# Patient Record
Sex: Male | Born: 1985 | Race: Black or African American | Hispanic: No | Marital: Single | Smoking: Former smoker
Health system: Southern US, Community
[De-identification: ages and names within clinical notes are randomized; demographics above are authoritative.]

---

## 2015-07-11 DIAGNOSIS — R0602 Shortness of breath: Secondary | ICD-10-CM | POA: Insufficient documentation

## 2015-07-11 DIAGNOSIS — R079 Chest pain, unspecified: Secondary | ICD-10-CM | POA: Diagnosis present

## 2015-07-11 DIAGNOSIS — Z720 Tobacco use: Secondary | ICD-10-CM | POA: Insufficient documentation

## 2015-07-11 DIAGNOSIS — K219 Gastro-esophageal reflux disease without esophagitis: Secondary | ICD-10-CM | POA: Diagnosis not present

## 2015-07-12 ENCOUNTER — Emergency Department (HOSPITAL_COMMUNITY)
Admission: EM | Admit: 2015-07-12 | Discharge: 2015-07-12 | Disposition: A | Discharge status: Home / Self Care | Payer: BLUE CROSS/BLUE SHIELD | Attending: Emergency Medicine | Admitting: Emergency Medicine

## 2015-07-12 ENCOUNTER — Emergency Department (HOSPITAL_COMMUNITY): Payer: BLUE CROSS/BLUE SHIELD

## 2015-07-12 ENCOUNTER — Encounter (HOSPITAL_COMMUNITY): Payer: Self-pay | Admitting: *Deleted

## 2015-07-12 DIAGNOSIS — K219 Gastro-esophageal reflux disease without esophagitis: Secondary | ICD-10-CM

## 2015-07-12 LAB — BASIC METABOLIC PANEL
ANION GAP: 9 (ref 5–15)
BUN: 11 mg/dL (ref 6–20)
CO2: 28 mmol/L (ref 22–32)
Calcium: 8.6 mg/dL — ABNORMAL LOW (ref 8.9–10.3)
Chloride: 98 mmol/L — ABNORMAL LOW (ref 101–111)
Creatinine, Ser: 1.01 mg/dL (ref 0.61–1.24)
GFR calc Af Amer: >60 mL/min (ref >60)
GFR calc non Af Amer: >60 mL/min (ref >60)
GLUCOSE: 94 mg/dL (ref 65–99)
POTASSIUM: 3.4 mmol/L — AB (ref 3.5–5.1)
Sodium: 135 mmol/L (ref 135–145)

## 2015-07-12 LAB — CBC
HEMATOCRIT: 37.8 % — AB (ref 39.0–52.0)
HEMOGLOBIN: 12.7 g/dL — AB (ref 13.0–17.0)
MCH: 29.5 pg (ref 26.0–34.0)
MCHC: 33.6 g/dL (ref 30.0–36.0)
MCV: 87.7 fL (ref 78.0–100.0)
Platelets: 225 10*3/uL (ref 150–400)
RBC: 4.31 MIL/uL (ref 4.22–5.81)
RDW: 11.8 % (ref 11.5–15.5)
WBC: 6.7 10*3/uL (ref 4.0–10.5)

## 2015-07-12 LAB — I-STAT TROPONIN, ED
TROPONIN I, POC: 0 ng/mL (ref 0.00–0.08)
Troponin i, poc: 0 ng/mL (ref 0.00–0.08)

## 2015-07-12 MED ORDER — DICYCLOMINE HCL 10 MG/ML IM SOLN
20.0000 mg | Freq: Once | INTRAMUSCULAR | Status: AC
  Administered 2015-07-12: 20 mg via INTRAMUSCULAR
  Filled 2015-07-12: qty 2

## 2015-07-12 MED ORDER — OMEPRAZOLE 20 MG PO CPDR: 20.0000 mg | DELAYED_RELEASE_CAPSULE | Freq: Every day | ORAL | Status: DC

## 2015-07-12 MED ORDER — GI COCKTAIL ~~LOC~~
30.0000 mL | Freq: Once | ORAL | Status: AC
  Administered 2015-07-12: 30 mL via ORAL
  Filled 2015-07-12: qty 30

## 2015-07-12 NOTE — ED Provider Notes (Signed)
CSN: [id number]   Arrival date & time 07/11/15 2353  History  By signing my name below, I, Justin Haas, attest that this documentation has been prepared under the direction and in the presence of Justin Handyside, MD. Electronically Signed: Bethel Haas, ED Scribe. 07/12/2015. 3:17 AM.  Chief Complaint  Patient presents with  . Chest Pain  . Shortness of Breath    HPI Patient is a 29 y.o. male presenting with chest pain. The history is provided by the patient. No language interpreter was used.  Chest Pain Chest pain location: Central chest. Pain quality: tightness   Pain quality comment:  Heaviness Pain radiates to:  Does not radiate Pain radiates to the back: no   Pain severity:  Moderate Onset quality:  Gradual Duration:  6 hours Timing:  Constant Progression:  Improving Chronicity:  New Context: eating   Relieved by:  Nothing Worsened by:  Nothing tried Ineffective treatments:  None tried Associated symptoms: nausea, shortness of breath and vomiting   Risk factors: male sex   Risk factors: no diabetes mellitus, no hypertension, no prior DVT/PE and no surgery    Justin Haas is a 29 y.o. male who presents to the Emergency Department complaining of constant central chest pain with gradual onset around 10 PM last night after eating dinner. He ate a sandwich (with mayo, mustard, and [place]) and BBQ chips. The pain is described as tightness/heaviness and rated 5/10 in severity. Associated symptoms include SOB, nausea, vomiting (2 episodes after going to bed), and belching(10+ episodes). Pt denies flatulence, a bitter taste in the mouth, and LE swelling. No recent travel.   History reviewed. No pertinent past medical history.  History reviewed. No pertinent past surgical history.  History reviewed. No pertinent family history.  Social History  Substance Use Topics  . Smoking status: Current Every Day Smoker -- 0.50 packs/day    Types: Cigarettes  . Smokeless tobacco:  None  . Alcohol Use: Yes     Review of Systems  Respiratory: Positive for shortness of breath.   Cardiovascular: Positive for chest pain.  Gastrointestinal: Positive for nausea and vomiting.       Belching  All other systems reviewed and are negative.  Home Medications   Prior to Admission medications   Not on File    Allergies  Review of patient's allergies indicates no known allergies.  Triage Vitals: BP 128/73 mmHg  Pulse 72  Temp(Src) 98 F (36.7 C) (Oral)  Resp 18  Ht  (1.905 m)  Wt 198 lb 8 oz (90.039 kg)  BMI 24.81 kg/m2  SpO2 99%  Physical Exam  Constitutional: He is oriented to person, place, and time. He appears well-developed and well-nourished. No distress.  HENT:  Head: Normocephalic.  Mouth/Throat: Oropharynx is clear and moist. No oropharyngeal exudate.  Moist mucous membranes No exudate  Eyes: Pupils are equal, round, and reactive to light.  Neck: Normal range of motion. Neck supple.  Trachea midline No crepitance  Cardiovascular: Normal rate, regular rhythm and intact distal pulses.   Pulmonary/Chest: Effort normal and breath sounds normal. No stridor. No respiratory distress. He has no wheezes. He has no rales.  CTAB  Abdominal: Soft. He exhibits no mass. Bowel sounds are increased. There is no tenderness. There is no rebound and no guarding.  Musculoskeletal: Normal range of motion. He exhibits no edema or tenderness.  Neurological: He is alert and oriented to person, place, and time. He has normal reflexes.  Skin: Skin is warm and dry.  He is not diaphoretic.  Psychiatric: He has a normal mood and affect. His behavior is normal.  Nursing note and vitals reviewed.   ED Course  Procedures   DIAGNOSTIC STUDIES: Oxygen Saturation is 99% on RA, normal by my interpretation.    COORDINATION OF CARE: 3:12 AM Discussed treatment plan which includes CXR, EKG, lab work, GI coktail with pt at bedside and pt agreed to plan.  Labs Reviewed   BASIC METABOLIC PANEL - Abnormal; Notable for the following:    Potassium 3.4 (*)    Chloride 98 (*)    Calcium 8.6 (*)    All other components within normal limits  CBC - Abnormal; Notable for the following:    Hemoglobin 12.7 (*)    HCT 37.8 (*)    All other components within normal limits  I-STAT TROPOININ, ED  I-STAT TROPOININ, ED    Imaging Review Dg Chest 2 View  07/12/2015  CLINICAL DATA:  Acute onset of mid chest tightness and shortness of breath. Initial encounter. EXAM: CHEST  2 VIEW COMPARISON:  None. FINDINGS: The lungs are well-aerated and clear. There is no evidence of focal opacification, pleural effusion or pneumothorax. The heart is normal in size; the mediastinal contour is within normal limits. No acute osseous abnormalities are seen. IMPRESSION: No acute cardiopulmonary process seen. Electronically Signed   By: Justin RaiderJeffery  Chang M.D.   On: 07/12/2015 00:57    I personally reviewed and evaluated these images and lab results as a part of my medical decision-making.   EKG Interpretation  Date/Time:  Tuesday July 12 2015 00:09:44 EDT Ventricular Rate:  74 PR Interval:  134 QRS Duration: 86 QT Interval:  390 QTC Calculation: 432 R Axis:   66 Text Interpretation:  Normal sinus rhythm Confirmed by [facility]  MD, Justin Haas ([id number]) on 07/12/2015 2:50:02 AM    MDM   Final diagnoses:  None   HEART Score is 0. MI ruled out with normal EKG and negative troponin X 2. Well's Score is 0. PERC negative. Suspect GERD. Symptoms resolved post GI cocktail will write for omeprazole and gerd friendly diet.    I personally performed the services described in this documentation, which was scribed in my presence. The recorded information has been reviewed and is accurate.      Justin BlamerApril Lorali Khamis, MD 07/12/15 [phone]

## 2015-07-12 NOTE — ED Notes (Signed)
Pt reports gradually worsening central chest heaviness that occurred shortly after eating, pt experienced n/v x3 episodes and increasing shortness of breath when he attempted to go to sleep. Denies dizziness or light headedness. Pt is no acute distress, skin warm and dry.

## 2015-09-03 ENCOUNTER — Emergency Department (HOSPITAL_COMMUNITY)
Admission: EM | Admit: 2015-09-03 | Discharge: 2015-09-03 | Disposition: A | Discharge status: Home / Self Care | Payer: BLUE CROSS/BLUE SHIELD | Attending: Emergency Medicine | Admitting: Emergency Medicine

## 2015-09-03 ENCOUNTER — Emergency Department (HOSPITAL_COMMUNITY): Payer: BLUE CROSS/BLUE SHIELD

## 2015-09-03 ENCOUNTER — Encounter (HOSPITAL_COMMUNITY): Payer: Self-pay | Admitting: Emergency Medicine

## 2015-09-03 DIAGNOSIS — W2209XA Striking against other stationary object, initial encounter: Secondary | ICD-10-CM | POA: Insufficient documentation

## 2015-09-03 DIAGNOSIS — S92424A Nondisplaced fracture of distal phalanx of right great toe, initial encounter for closed fracture: Secondary | ICD-10-CM | POA: Insufficient documentation

## 2015-09-03 DIAGNOSIS — F17210 Nicotine dependence, cigarettes, uncomplicated: Secondary | ICD-10-CM | POA: Insufficient documentation

## 2015-09-03 DIAGNOSIS — Y9389 Activity, other specified: Secondary | ICD-10-CM | POA: Insufficient documentation

## 2015-09-03 DIAGNOSIS — Y9289 Other specified places as the place of occurrence of the external cause: Secondary | ICD-10-CM | POA: Diagnosis not present

## 2015-09-03 DIAGNOSIS — S99921A Unspecified injury of right foot, initial encounter: Secondary | ICD-10-CM | POA: Diagnosis present

## 2015-09-03 DIAGNOSIS — Y999 Unspecified external cause status: Secondary | ICD-10-CM | POA: Diagnosis not present

## 2015-09-03 DIAGNOSIS — S92911A Unspecified fracture of right toe(s), initial encounter for closed fracture: Secondary | ICD-10-CM

## 2015-09-03 MED ORDER — NAPROXEN 500 MG PO TABS: 500.0000 mg | ORAL_TABLET | Freq: Two times a day (BID) | ORAL | Status: DC

## 2015-09-03 MED ORDER — HYDROCODONE-ACETAMINOPHEN 5-325 MG PO TABS: ORAL_TABLET | ORAL | Status: DC

## 2015-09-03 NOTE — ED Notes (Signed)
Josh, pa-c at the bedside. Verbal order for postop boot.

## 2015-09-03 NOTE — ED Notes (Signed)
Post op shoe applied to right foot. Patient reports comfortable fit when ambulating.

## 2015-09-03 NOTE — ED Notes (Signed)
Stumped R great toe around 5pm yesterday.  C/o pain and swelling.

## 2015-09-03 NOTE — Discharge Instructions (Signed)
Please read and follow all provided instructions.  Your diagnoses today include:  1. Fracture of great toe, left, closed, initial encounter     Tests performed today include:  An x-ray of the affected area - shows broken toe, not out of place  Vital signs. See below for your results today.   Medications prescribed:   Vicodin (hydrocodone/acetaminophen) - narcotic pain medication  DO NOT drive or perform any activities that require you to be awake and alert because this medicine can make you drowsy. BE VERY CAREFUL not to take multiple medicines containing Tylenol (also called acetaminophen). Doing so can lead to an overdose which can damage your liver and cause liver failure and possibly death.   Naproxen - anti-inflammatory pain medication  Do not exceed 500mg  naproxen every 12 hours, take with food  You have been prescribed an anti-inflammatory medication or NSAID. Take with food. Take smallest effective dose for the shortest duration needed for your pain. Stop taking if you experience stomach pain or vomiting.   Take any prescribed medications only as directed.  Home care instructions:   Follow any educational materials contained in this packet  Follow R.I.C.E. Protocol:  R - rest your injury   I  - use ice on injury without applying directly to skin  C - compress injury with bandage or splint  E - elevate the injury as much as possible  Follow-up instructions: Please follow-up with your primary care provider or the provided orthopedic physician (bone specialist) if you continue to have significant pain in 1 week.   Return instructions:   Please return if your toes or feet are numb or tingling, appear gray or blue, or you have severe pain (also elevate the leg and loosen splint or wrap if you were given one)  Please return to the Emergency Department if you experience worsening symptoms.   Please return if you have any other emergent concerns.  Additional  Information:  Your vital signs today were: BP 136/74 mmHg   Pulse 60   Temp(Src) 98 F (36.7 C) (Oral)   Resp 16   SpO2 100% If your blood pressure (BP) was elevated above 135/85 this visit, please have this repeated by your doctor within one month. --------------

## 2015-09-03 NOTE — ED Provider Notes (Signed)
CSN: [id number]     Arrival date & time 09/03/15  0315 History   First MD Initiated Contact with Patient 09/03/15 0601     Chief Complaint  Patient presents with  . Toe Pain     (Consider location/radiation/quality/duration/timing/severity/associated sxs/prior Treatment) HPI Comments: Patient presents with complaint of acute onset right great toe pain starting yesterday at approximately 5 PM. Patient stubbed his toe on a piece of metal. Patient complains of worsening pain and swelling. He went to work last night and was having difficulty with walking due to the pain. No treatments prior to arrival. Course is constant.  The history is provided by the patient.    History reviewed. No pertinent past medical history. History reviewed. No pertinent past surgical history. No family history on file. Social History  Substance Use Topics  . Smoking status: Current Every Day Smoker -- 0.50 packs/day    Types: Cigarettes  . Smokeless tobacco: None  . Alcohol Use: Yes    Review of Systems  Constitutional: Negative for activity change.  Musculoskeletal: Positive for joint swelling, arthralgias and gait problem. Negative for back pain and neck pain.  Skin: Negative for wound.  Neurological: Negative for weakness and numbness.      Allergies  Review of patient's allergies indicates no known allergies.  Home Medications   Prior to Admission medications   Medication Sig Start Date End Date Taking? Authorizing Provider  diphenhydrAMINE (BENADRYL) 25 mg capsule Take 25 mg by mouth every 6 (six) hours as needed for allergies.   Yes Historical Provider, MD  ibuprofen (ADVIL,MOTRIN) 200 MG tablet Take 600 mg by mouth every 6 (six) hours as needed for mild pain.   Yes Historical Provider, MD  omeprazole (PRILOSEC) 20 MG capsule Take 1 capsule (20 mg total) by mouth daily. Patient not taking: Reported on 09/03/2015 07/12/15   April Palumbo, MD   BP 142/84 mmHg  Pulse 73  Temp(Src) 98 F  (36.7 C) (Oral)  Resp 16  SpO2 100% Physical Exam  Constitutional: He appears well-developed and well-nourished.  HENT:  Head: Normocephalic and atraumatic.  Eyes: Conjunctivae are normal.  Neck: Normal range of motion. Neck supple.  Cardiovascular: Normal pulses.   Musculoskeletal: He exhibits tenderness. He exhibits no edema.       Right ankle: Normal.       Right foot: There is tenderness, bony tenderness and swelling.       Feet:  Neurological: He is alert. No sensory deficit.  Motor, sensation, and vascular distal to the injury is fully intact.   Skin: Skin is warm and dry.  Psychiatric: He has a normal mood and affect.  Nursing note and vitals reviewed.   ED Course  Procedures (including critical care time) Labs Review Labs Reviewed - No data to display  Imaging Review Dg Foot Complete Right  09/03/2015  CLINICAL DATA:  Stubbed great toe on a bed railing today.  Edema. EXAM: RIGHT FOOT COMPLETE - 3+ VIEW COMPARISON:  None. FINDINGS: Vague linear lucency below the distal phalangeal tuft of the right first toe. This suggest a nondisplaced fracture. No articular extension. No radiopaque soft tissue foreign bodies and no soft tissue gas collections. Right foot is otherwise unremarkable. IMPRESSION: Vague linear lucency along the shaft of the distal phalanx of the right first toe suggesting nondisplaced fracture. Electronically Signed   By: Burman Nieves M.D.   On: 09/03/2015 04:33   Dg Toe Great Right  09/03/2015  CLINICAL DATA:  Patient stubbed his right  big toe last night. Pain and swelling. EXAM: RIGHT GREAT TOE COMPARISON:  Right foot 09/03/2015 FINDINGS: Linear lucencies along the midshaft of the distal phalanx of the right first toe suggesting nondisplaced fractures. No articular involvement. The tuft itself appears to be spared. No soft tissue gas or foreign bodies. IMPRESSION: Linear lucencies in the distal phalanx of the right first toe suggesting nondisplaced  fractures. Electronically Signed   By: Burman NievesWilliam  Stevens M.D.   On: 09/03/2015 06:34   I have personally reviewed and evaluated these images and lab results as part of my medical decision-making.   EKG Interpretation None       7:13 AM Patient seen and examined.   Vital signs reviewed and are as follows: BP 136/74 mmHg  Pulse 60  Temp(Src) 98 F (36.7 C) (Oral)  Resp 16  SpO2 100%  Patient informed of x-ray results. Postop shoe given. Encouraged PCP/with a follow-up one week if still having difficulty walking or severe pain.  Patient counseled on use of narcotic pain medications. Counseled not to combine these medications with others containing tylenol. Urged not to drink alcohol, drive, or perform any other activities that requires focus while taking these medications. The patient verbalizes understanding and agrees with the plan.   MDM   Final diagnoses:  Fracture of toe, right, closed, initial encounter   Nondisplaced fracture of right great toe, distal phalanx. Pain control, rice protocol indicated. Toe is neurovascularly intact. No open wounds. No nail injury.    Renne CriglerJoshua Zuleima Haser, PA-C 09/03/15 [id number]  Tomasita CrumbleAdeleke Oni, MD 09/03/15 [phone]

## 2017-08-26 ENCOUNTER — Emergency Department (HOSPITAL_COMMUNITY)
Admission: EM | Admit: 2017-08-26 | Discharge: 2017-08-26 | Disposition: A | Discharge status: Home / Self Care | Payer: 59 | Attending: Emergency Medicine | Admitting: Emergency Medicine

## 2017-08-26 DIAGNOSIS — K0889 Other specified disorders of teeth and supporting structures: Secondary | ICD-10-CM | POA: Diagnosis not present

## 2017-08-26 DIAGNOSIS — J029 Acute pharyngitis, unspecified: Secondary | ICD-10-CM | POA: Diagnosis not present

## 2017-08-26 DIAGNOSIS — R05 Cough: Secondary | ICD-10-CM | POA: Insufficient documentation

## 2017-08-26 DIAGNOSIS — Z79899 Other long term (current) drug therapy: Secondary | ICD-10-CM | POA: Diagnosis not present

## 2017-08-26 DIAGNOSIS — F17210 Nicotine dependence, cigarettes, uncomplicated: Secondary | ICD-10-CM | POA: Diagnosis not present

## 2017-08-26 LAB — RAPID STREP SCREEN (MED CTR MEBANE ONLY): Streptococcus, Group A Screen (Direct): NEGATIVE

## 2017-08-26 MED ORDER — LIDOCAINE VISCOUS 2 % MT SOLN: 15.0000 mL | OROMUCOSAL | 0 refills | Status: DC | PRN

## 2017-08-26 NOTE — Discharge Instructions (Signed)
Please read and follow all provided instructions.  Your diagnoses today include: Viral Pharyngitis   Tests performed today include: Vital signs. See below for your results today.  Strep Test: This was negative. A throat culture was sent as a precaution and results will be available in 2-3 days. If it returns positive for strep, you will be called by our flow manager for further instructions.  Medications prescribed:  1. Take mucinex over the counter for nasal congestoin 2. Take ibuprofen for pain 3. Take viscous lidocaine for throat pain. Swish, gargle and spit as directed.   Please follow up with dentist for dental pain for dental pain using recourses.   Home care instructions:  At this time, it appears that your sore throat is caused by a viral infection. Antibiotics do NOT help a viral infection and can cause unwanted side effects. The fever should resolve in 2-3 days and sore throat should begin to resolve in 2-3 days as well. Continued to alternate between Tylenol and ibuprofen for pain. May consider over-the-counter Benadryl for additional relief (decrease secretions). Also discard your toothbrush and begin using a new one in 3 days.   Follow-up instructions: Please follow-up with your primary care provider in 2-3 days for follow up.   Return instructions:  Return to the ED sooner for worsening condition, inability to swallow, breathing difficulty, new concerns.  Additional Information:  Your vital signs today were: BP 122/78 (BP Location: Right Arm)    Pulse 66    Temp 98.8 F (37.1 C) (Oral)    Resp 16    Ht 6\' 3"  (1.905 m)    Wt 81.6 kg (180 lb)    SpO2 99%    BMI 22.50 kg/m  If your blood pressure (BP) was elevated above 135/85 this visit, please have this repeated by your doctor within one month. ---------------

## 2017-08-26 NOTE — ED Triage Notes (Signed)
Pt states since last night he has had throat pain into neck. Pt states he has had a cold for the last week. Pt breathing easily, no sob. Tonsils appear red.

## 2017-08-26 NOTE — ED Provider Notes (Signed)
[facility] Provider Note   CSN: [id number] Arrival date & time: 08/26/17  1224     History   Chief Complaint Chief Complaint  Patient presents with  . Sore Throat    HPI Justin Haas is a 31 y.o. male with no significant past medical history presents emergency department today for sore throat.  Patient states that over the last 1 week he has been having URI symptoms including non-productive cough, chest congestion, runny nose, postnasal drip and sore throat.  He notes his sore throat started as scratchiness and now has associated dysphasia.  He has been taking Benadryl every day for this with no relief.  He notes that he has taken ibuprofen today with mild to moderate relief.  Positive sick contacts including his daughter who has viral URI diagnosed 2 weeks ago.  His cough has resolved but he still has the congestion, runny nose and sore throat which is his reason for presenting today. Patient also complains that since having dental work done in September he feels pain in his left lower tooth on and off.  He notes no change in this recently.  Denies fever, chills, inability to control secretions, N/V, abdominal pain, voice change, sob, or trauma.    HPI  No past medical history on file.  There are no active problems to display for this patient.   No past surgical history on file.     Home Medications    Prior to Admission medications   Medication Sig Start Date End Date Taking? Authorizing Provider  diphenhydrAMINE (BENADRYL) 25 mg capsule Take 25 mg by mouth every 6 (six) hours as needed for allergies.    [provider]  HYDROcodone-acetaminophen (NORCO/VICODIN) 5-325 MG tablet Take 1-2 tablets every 6 hours as needed for severe pain 09/03/15   Renne CriglerGeiple, Joshua, PA-C  ibuprofen (ADVIL,MOTRIN) 200 MG tablet Take 600 mg by mouth every 6 (six) hours as needed for mild pain.    [provider]  naproxen (NAPROSYN) 500 MG  tablet Take 1 tablet (500 mg total) by mouth 2 (two) times daily. 09/03/15   Renne CriglerGeiple, Joshua, PA-C  omeprazole (PRILOSEC) 20 MG capsule Take 1 capsule (20 mg total) by mouth daily. Patient not taking: Reported on 09/03/2015 07/12/15   Cy BlamerPalumbo, April, MD    Family History No family history on file.  Social History Social History   Tobacco Use  . Smoking status: Current Every Day Smoker    Packs/day: 0.50    Types: Cigarettes  Substance Use Topics  . Alcohol use: Yes  . Drug use: Yes    Types: IV     Allergies   Patient has no known allergies.   Review of Systems Review of Systems  Constitutional: Negative for chills and fever.  HENT: Positive for congestion, dental problem, postnasal drip, rhinorrhea and sore throat. Negative for drooling, ear discharge, ear pain, facial swelling, sinus pressure, sinus pain, sneezing, trouble swallowing and voice change.   Respiratory: Positive for cough. Negative for chest tightness and shortness of breath.   Cardiovascular: Negative for chest pain.  Gastrointestinal: Negative for nausea and vomiting.  Musculoskeletal: Negative for neck stiffness.     Physical Exam Updated Vital Signs BP 122/78 (BP Location: Right Arm)   Pulse 66   Temp 98.8 F (37.1 C) (Oral)   Resp 16   Ht 6\' 3"  (1.905 m)   Wt 81.6 kg (180 lb)   SpO2 99%   BMI 22.50 kg/m   Physical  Exam  Constitutional: He appears well-developed and well-nourished.  HENT:  Head: Normocephalic and atraumatic.  Right Ear: External ear normal.  Left Ear: External ear normal.  Nose: Nose normal.  Mouth/Throat: Uvula is midline, oropharynx is clear and moist and mucous membranes are normal. No tonsillar exudate.  The patient has normal phonation and is in control of secretions. No stridor.  Midline uvula without edema. Soft palate rises symmetrically. Mild tonsillar erythema bilaterally without hypertophy or exudates. No PTA. Tongue protrusion is normal. No trismus. No creptius  on neck palpation and patient has good dentition. No facial or neck swelling. No gingival erythema or fluctuance noted. No floor edema. Mucus membranes moist.   Eyes: Pupils are equal, round, and reactive to light. Right eye exhibits no discharge. Left eye exhibits no discharge. No scleral icterus.  Neck: Trachea normal and phonation normal. Neck supple. No spinous process tenderness present. No neck rigidity. Normal range of motion present.  No meningismus  Cardiovascular: Normal rate, regular rhythm and intact distal pulses.  No murmur heard. Pulses:      Radial pulses are 2+ on the right side, and 2+ on the left side.       Dorsalis pedis pulses are 2+ on the right side, and 2+ on the left side.       Posterior tibial pulses are 2+ on the right side, and 2+ on the left side.  No lower extremity swelling or edema. Calves symmetric in size bilaterally.  Pulmonary/Chest: Effort normal and breath sounds normal. He exhibits no tenderness.  Abdominal: Soft. Bowel sounds are normal. There is no tenderness. There is no rebound and no guarding.  Musculoskeletal: He exhibits no edema.  Lymphadenopathy:    He has cervical adenopathy (shotty).  Neurological: He is alert.  Skin: Skin is warm and dry. No rash noted. He is not diaphoretic.  Psychiatric: He has a normal mood and affect.  Nursing note and vitals reviewed.    ED Treatments / Results  Labs (all labs ordered are listed, but only abnormal results are displayed) Labs Reviewed  RAPID STREP SCREEN (NOT AT [facility])  CULTURE, GROUP A STREP [facility]    EKG  EKG Interpretation None       Radiology No results found.  Procedures Procedures (including critical care time)  Medications Ordered in ED Medications - No data to display   Initial Impression / Assessment and Plan / ED Course  I have reviewed the triage vital signs and the nursing notes.  Pertinent labs & imaging results that were available during my care of the patient  were reviewed by me and considered in my medical decision making (see chart for details).     31 y.o. male with sore throat. Pt afebrile without tonsillar exudate, negative strep. Presents with mild cervical lymphadenopathy, & dysphagia; diagnosis of viral pharyngitis. No abx indicated. Discharged with symptomatic tx for pain.  Pt does not appear dehydrated, but did discuss importance of water rehydration. Presentation non concerning for PTA or RPA. No trismus or uvula deviation. Specific return precautions discussed. Pt able to drink water in ED without difficulty with intact air way. Recommended PCP follow up.  Patient with toothache.  No gross abscess or evidence of periapical abscess.  Exam unconcerning for Ludwig's angina or spread of infection.  Do not feel requires Abx. Will treat same as viral pharyngitis above and provide resources for patient to follow up with dentist. return precautions discussed. Patient appears safe for discharge.   Final Clinical Impressions(s) /  ED Diagnoses   Final diagnoses:  Viral pharyngitis  Pain, dental    ED Discharge Orders        Ordered    lidocaine (XYLOCAINE) 2 % solution  As needed     08/26/17 1409       Princella Pellegrini 08/26/17 1416    Jacalyn Lefevre, MD 08/26/17 1452

## 2017-08-28 ENCOUNTER — Encounter (HOSPITAL_COMMUNITY): Payer: Self-pay | Admitting: Emergency Medicine

## 2017-08-28 ENCOUNTER — Emergency Department (HOSPITAL_COMMUNITY)
Admission: EM | Admit: 2017-08-28 | Discharge: 2017-08-29 | Disposition: A | Discharge status: Home / Self Care | Payer: 59 | Attending: Emergency Medicine | Admitting: Emergency Medicine

## 2017-08-28 DIAGNOSIS — K0889 Other specified disorders of teeth and supporting structures: Secondary | ICD-10-CM | POA: Diagnosis present

## 2017-08-28 DIAGNOSIS — Z79899 Other long term (current) drug therapy: Secondary | ICD-10-CM | POA: Diagnosis not present

## 2017-08-28 DIAGNOSIS — F17210 Nicotine dependence, cigarettes, uncomplicated: Secondary | ICD-10-CM | POA: Insufficient documentation

## 2017-08-28 LAB — CULTURE, GROUP A STREP (THRC)

## 2017-08-28 MED ORDER — OXYCODONE-ACETAMINOPHEN 5-325 MG PO TABS
1.0000 | ORAL_TABLET | Freq: Once | ORAL | Status: AC
  Administered 2017-08-28: 1 via ORAL
  Filled 2017-08-28: qty 1

## 2017-08-28 NOTE — ED Triage Notes (Signed)
Patient reports persistent left upper/lower molar pain onset last week unrelieved by OTC pain medications .

## 2017-08-28 NOTE — ED Provider Notes (Signed)
[facility] Provider Note   CSN: [id number] Arrival date & time: 08/28/17  2121     History   Chief Complaint Chief Complaint  Patient presents with  . Dental Pain    HPI Justin Haas is a 31 y.o. male who presents to the emergency department with a chief complaint of left-sided dental pain.  He reports URI symptoms, including sore throat, nonproductive cough, chest congestion, rhinorrhea, postnasal drip that began 1 week ago, but resolved 2 days ago.  He reports that after his other symptoms resolved, that it felt like the dental pain was getting worse.  He also complains of associated left-sided neck pain.  He denies facial swelling, swelling to the neck, fever, chills, trismus, drooling, muffled voice, chest pain, shortness of breath.  He was seen and evaluated in the emergency department on 12/10 and discharged with viscous lidocaine for viral pharyngitis.  He has been treating his symptoms at home with viscous lidocaine, and 4-600 mg of Motrin twice a day without improvement.  He was given a dose of Percocet in the waiting room, and reported minimal improvement.  His last dental checkup was about 6 months ago, but the patient has not seen the dentist again because they were out of network with his dental insurance.  He is planning to call and get a dentist appointment in the next 1-2 days.  The history is provided by the patient. No language interpreter was used.    History reviewed. No pertinent past medical history.  There are no active problems to display for this patient.   History reviewed. No pertinent surgical history.     Home Medications    Prior to Admission medications   Medication Sig Start Date End Date Taking? Authorizing Provider  diphenhydrAMINE (BENADRYL) 25 mg capsule Take 25 mg by mouth every 6 (six) hours as needed for allergies.    [provider]  HYDROcodone-acetaminophen (NORCO/VICODIN) 5-325 MG  tablet Take 1-2 tablets every 6 hours as needed for severe pain 09/03/15   Renne CriglerGeiple, Joshua, PA-C  ibuprofen (ADVIL,MOTRIN) 200 MG tablet Take 600 mg by mouth every 6 (six) hours as needed for mild pain.    [provider]  lidocaine (XYLOCAINE) 2 % solution Use as directed 15 mLs in the mouth or throat as needed for mouth pain. 08/26/17   Maczis, Elmer SowMichael M, PA-C  naproxen (NAPROSYN) 500 MG tablet Take 1 tablet (500 mg total) by mouth 2 (two) times daily. 09/03/15   Renne CriglerGeiple, Joshua, PA-C  omeprazole (PRILOSEC) 20 MG capsule Take 1 capsule (20 mg total) by mouth daily. Patient not taking: Reported on 09/03/2015 07/12/15   Palumbo, April, MD  penicillin v potassium (VEETID) 500 MG tablet Take 1 tablet (500 mg total) by mouth 4 (four) times daily for 5 days. 08/29/17 09/03/17  Dajuan Turnley, Coral ElseMia A, PA-C    Family History No family history on file.  Social History Social History   Tobacco Use  . Smoking status: Current Every Day Smoker    Packs/day: 0.50    Types: Cigarettes  . Smokeless tobacco: Never Used  Substance Use Topics  . Alcohol use: Yes  . Drug use: Yes    Types: IV, Marijuana     Allergies   Patient has no known allergies.   Review of Systems Review of Systems  Constitutional: Negative for appetite change, chills and fever.  HENT: Positive for dental problem and ear pain. Negative for drooling, facial swelling, nosebleeds, postnasal drip, rhinorrhea and  trouble swallowing.   Eyes: Negative for pain and redness.  Respiratory: Negative for cough and wheezing.   Cardiovascular: Negative for chest pain.  Gastrointestinal: Negative for abdominal pain, nausea and vomiting.  Musculoskeletal: Negative for neck pain and neck stiffness.  Skin: Negative for color change and rash.  Neurological: Positive for headaches. Negative for weakness and light-headedness.  All other systems reviewed and are negative.    Physical Exam Updated Vital Signs BP (!) 164/97 (BP Location:  Right Arm)   Pulse 66   Temp 99.6 F (37.6 C) (Oral)   Resp 20   Ht 6\' 3"  (1.905 m)   Wt 81.6 kg (180 lb)   SpO2 100%   BMI 22.50 kg/m   Physical Exam  Constitutional: He appears well-developed.  HENT:  Head: Normocephalic.  Mouth/Throat: Uvula is midline, oropharynx is clear and moist and mucous membranes are normal. No trismus in the jaw. Normal dentition. No dental abscesses, uvula swelling or dental caries. No oropharyngeal exudate, posterior oropharyngeal edema, posterior oropharyngeal erythema or tonsillar abscesses. No tonsillar exudate.    No periodontal disease.  No gross dental caries.  Each individual maxillary and mandibular tooth on the left side of the mouth was individually palpated without focal tenderness.   Eyes: Conjunctivae are normal.  Neck: Normal range of motion. Neck supple. No tracheal deviation present.  No woody induration of the neck. No tripoding.  Cardiovascular: Normal rate, regular rhythm, normal heart sounds and intact distal pulses. Exam reveals no gallop and no friction rub.  No murmur heard. Pulmonary/Chest: Effort normal and breath sounds normal. No stridor. No respiratory distress. He has no wheezes. He has no rales. He exhibits no tenderness.  Abdominal: Soft. He exhibits no distension.  Lymphadenopathy:    He has no cervical adenopathy.  Neurological: He is alert.  Skin: Skin is warm and dry.  Psychiatric: His behavior is normal.  Nursing note and vitals reviewed.    ED Treatments / Results  Labs (all labs ordered are listed, but only abnormal results are displayed) Labs Reviewed - No data to display  EKG  EKG Interpretation None       Radiology No results found.  Procedures Procedures (including critical care time)  Medications Ordered in ED Medications  oxyCODONE-acetaminophen (PERCOCET/ROXICET) 5-325 MG per tablet 1 tablet (1 tablet Oral Given 08/28/17 2137)     Initial Impression / Assessment and Plan / ED Course    I have reviewed the triage vital signs and the nursing notes.  Pertinent labs & imaging results that were available during my care of the patient were reviewed by me and considered in my medical decision making (see chart for details).     Patient with toothache.  He was seen on 12/10 for similar, but symptoms have worsened. He is hemodynamically stable.  On physical exam, the patient has good dentition, but there is an area of edema and minimal redness to the buccal aspect of the gingiva surrounding tooth 17. No gross abscess. Exam unconcerning for Ludwig's angina or spread of infection.  No constitutional symptoms.  Will treat with penicillin and anti-inflammatories.  Urged patient to follow-up with dentist.  Dental resource list provided.  Strict return precautions given.  No acute distress.  The patient is safe for discharge at this time.  Final Clinical Impressions(s) / ED Diagnoses   Final diagnoses:  Pain, dental    ED Discharge Orders        Ordered    penicillin v potassium (VEETID) 500  MG tablet  4 times daily     08/29/17 0011       Barkley BoardsMcDonald, Aizley Stenseth A, PA-C 08/29/17 0013    Dione BoozeGlick, David, MD 08/29/17 [phone]

## 2017-08-29 MED ORDER — PENICILLIN V POTASSIUM 500 MG PO TABS: 500.0000 mg | ORAL_TABLET | Freq: Four times a day (QID) | ORAL | 0 refills | Status: AC

## 2017-08-29 NOTE — Discharge Instructions (Signed)
Use the provided dental guide to help find a dentist that accepts her dental insurance.  You can also go on your dental insurances website and search for dentist in the area that will accept her insurance.  Take 1 tablet of penicillin every 6 hours for the next 5 days.  Take 800 mg of ibuprofen with food every 8 hours to help with pain.  Scheduling this medication every 8 hours for the next 3-4 days may improve your symptoms.  You can continue to take 15 mL of viscous lidocaine every 3 hours as needed for mouth pain and sore throat.  If you develop new or worsening symptoms, including the inability to open her mouth, difficulty breathing, significant swelling to one side of the neck of the face, or fever that does not improve with Tylenol, please return to the emergency department for reevaluation.

## 2017-09-06 IMAGING — CR DG FOOT COMPLETE 3+V*R*
3 series · 3 of 3 positions shown · non-contrast
Comparison: None.

CLINICAL DATA: Stubbed great toe on a bed railing today.  Edema.

EXAM:
RIGHT FOOT COMPLETE - 3+ VIEW

[foot ap]
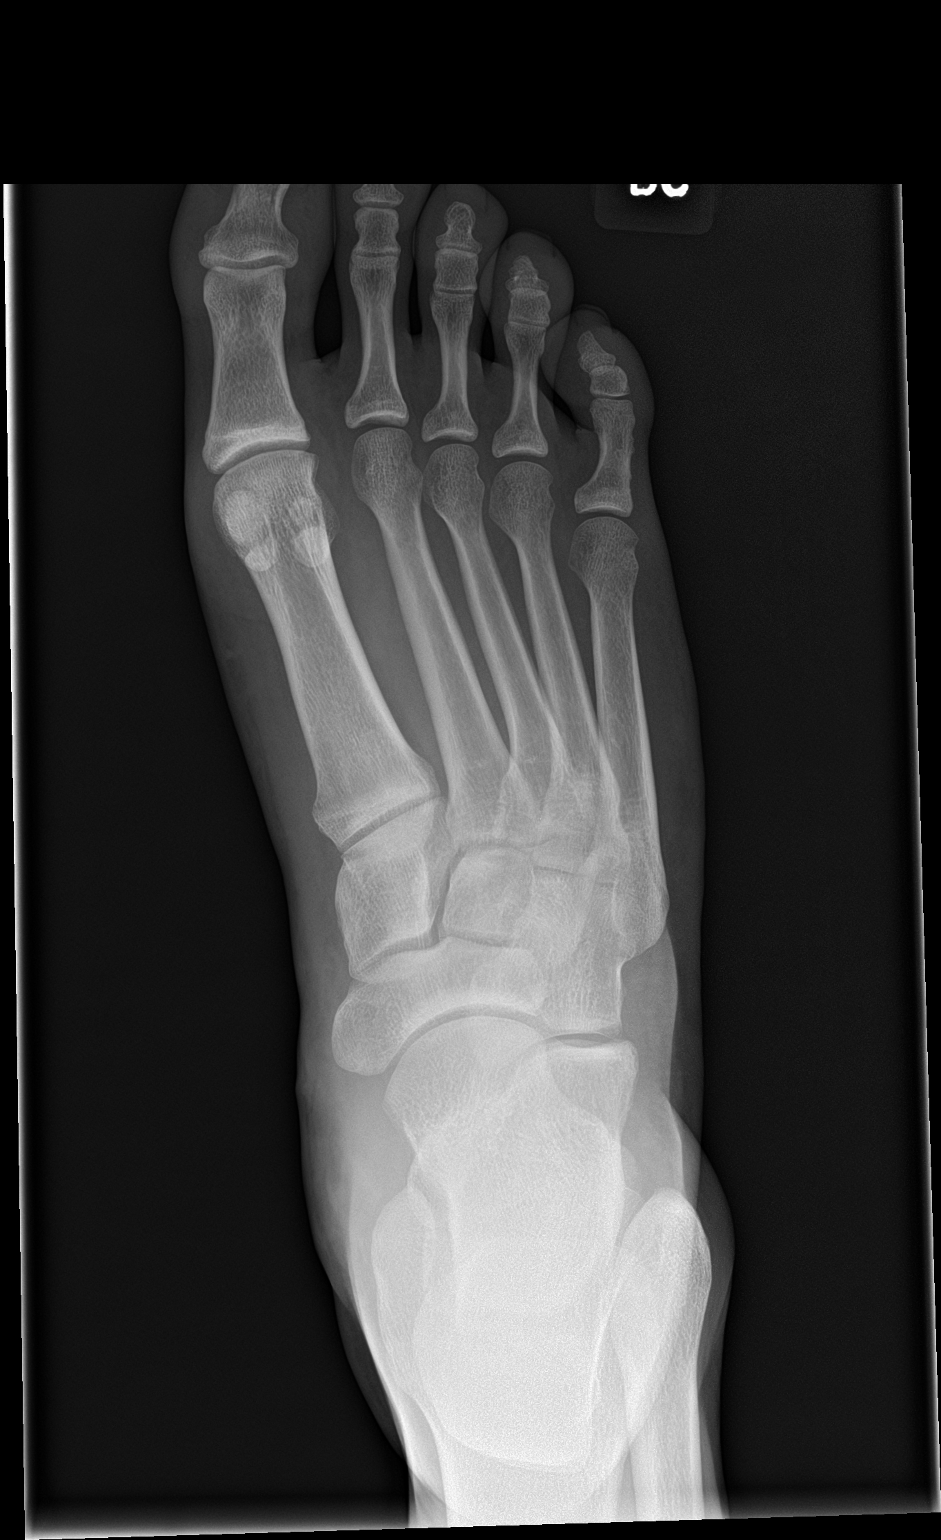

[foot obl]
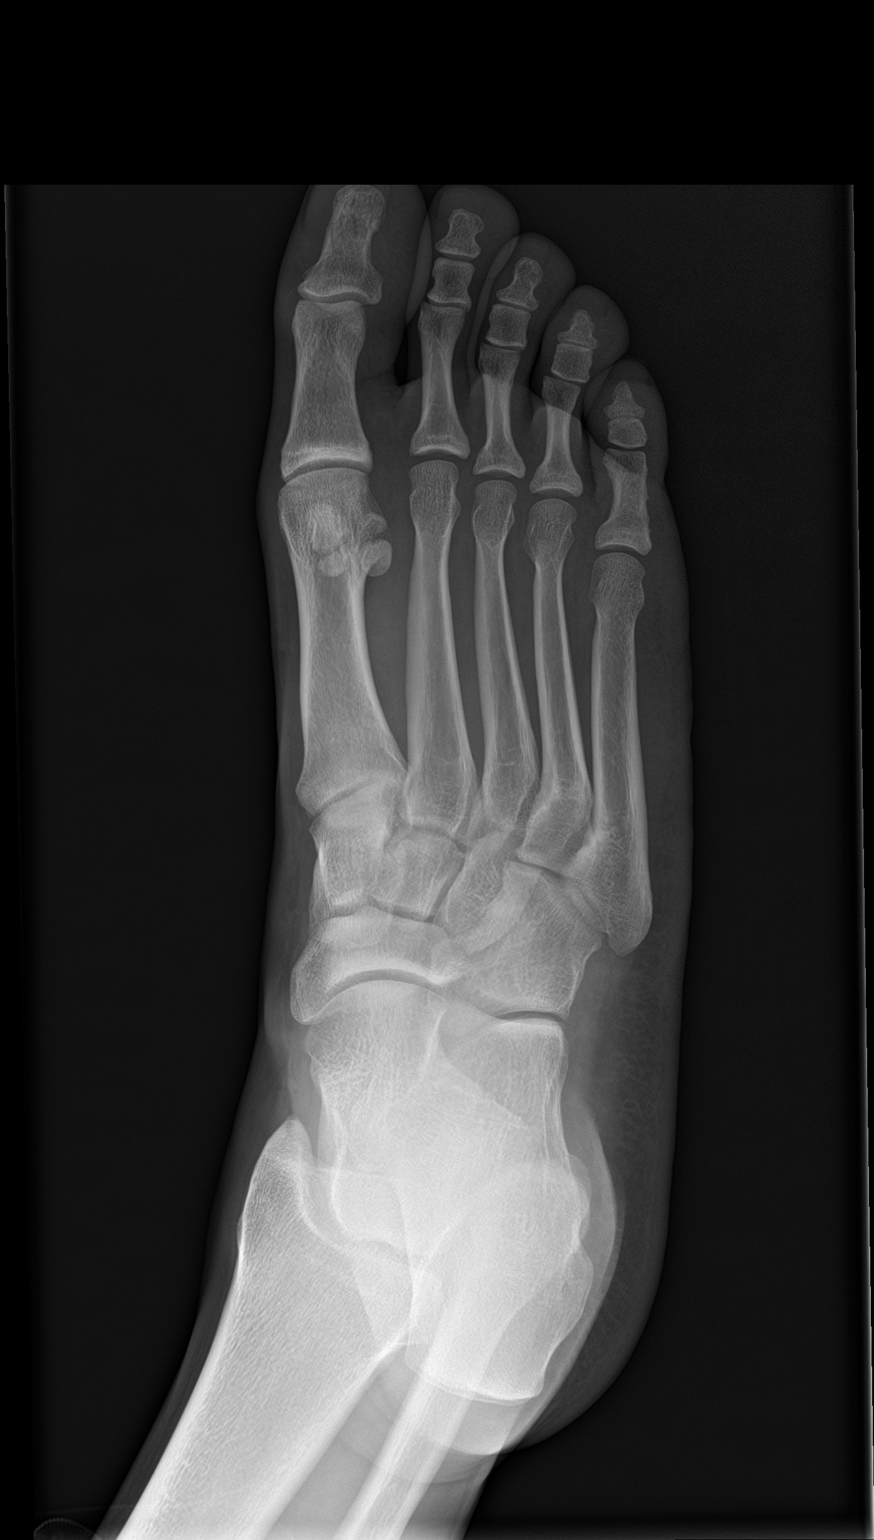

[foot lat]
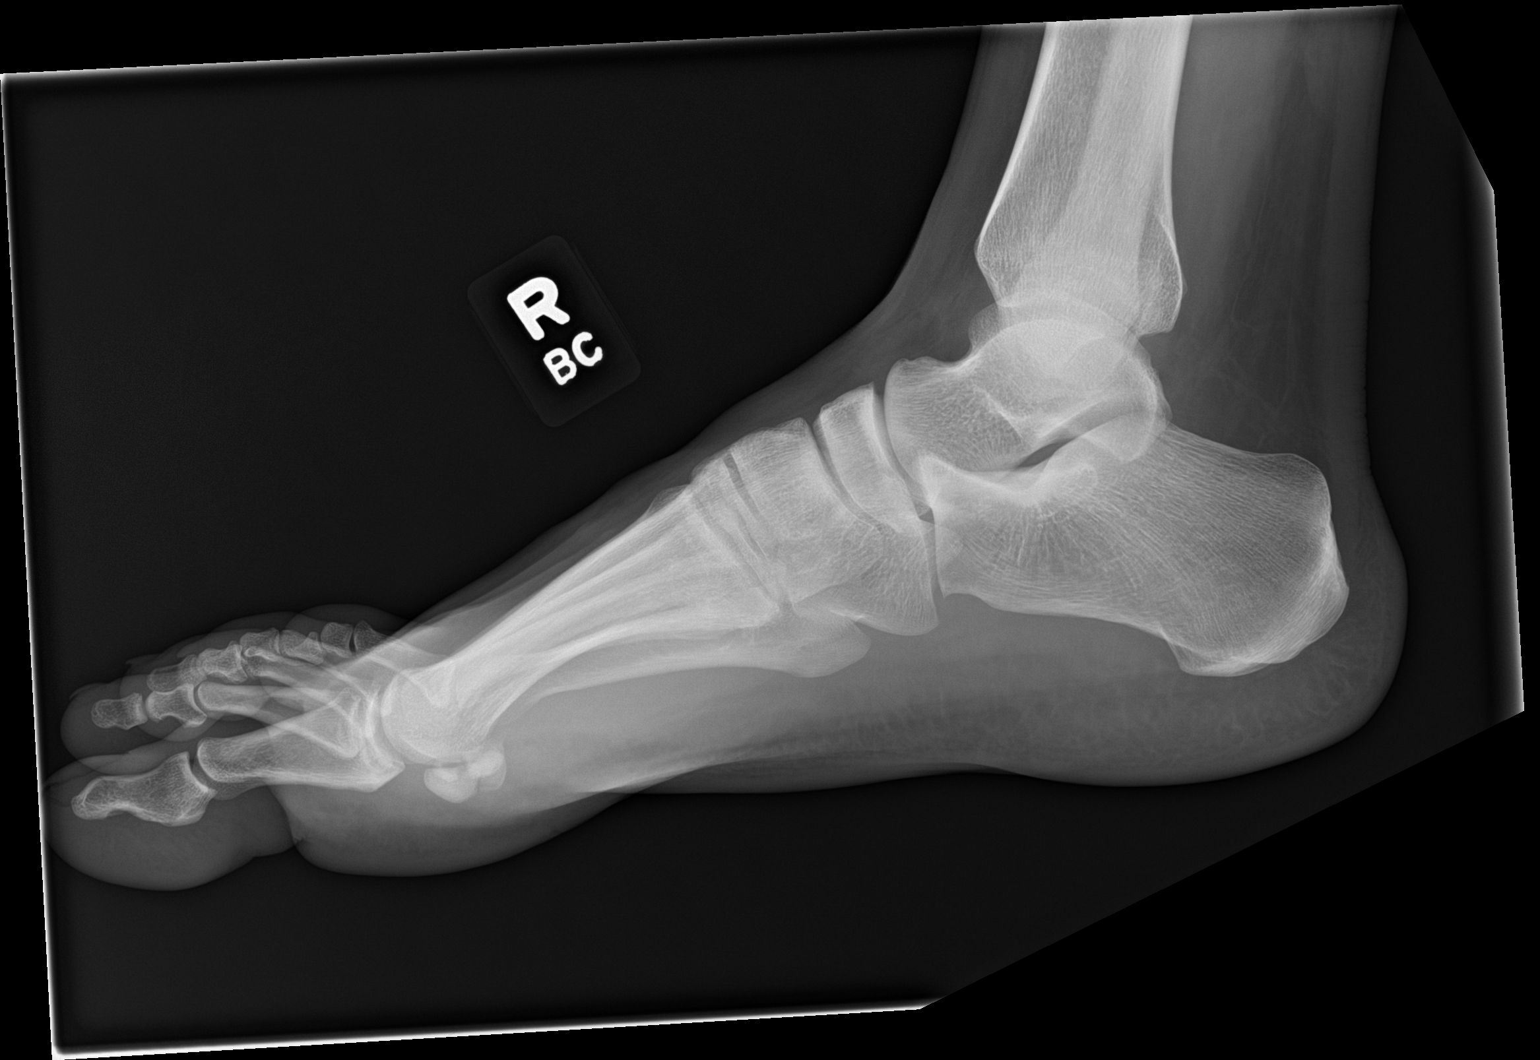

[3 of 3 positions shown; findings below may reference images not displayed]

FINDINGS: Vague linear lucency below the distal phalangeal tuft of the right
first toe. This suggest a nondisplaced fracture. No articular
extension. No radiopaque soft tissue foreign bodies and no soft
tissue gas collections. Right foot is otherwise unremarkable.
IMPRESSION: Vague linear lucency along the shaft of the distal phalanx of the
right first toe suggesting nondisplaced fracture.

## 2017-09-06 IMAGING — CR DG TOE GREAT 2+V*R*
3 series · 3 of 3 positions shown · non-contrast
Comparison: Right foot 09/03/2015

CLINICAL DATA: Patient stubbed his right big toe last night. Pain
and swelling.

EXAM:
RIGHT GREAT TOE

[toe ap]
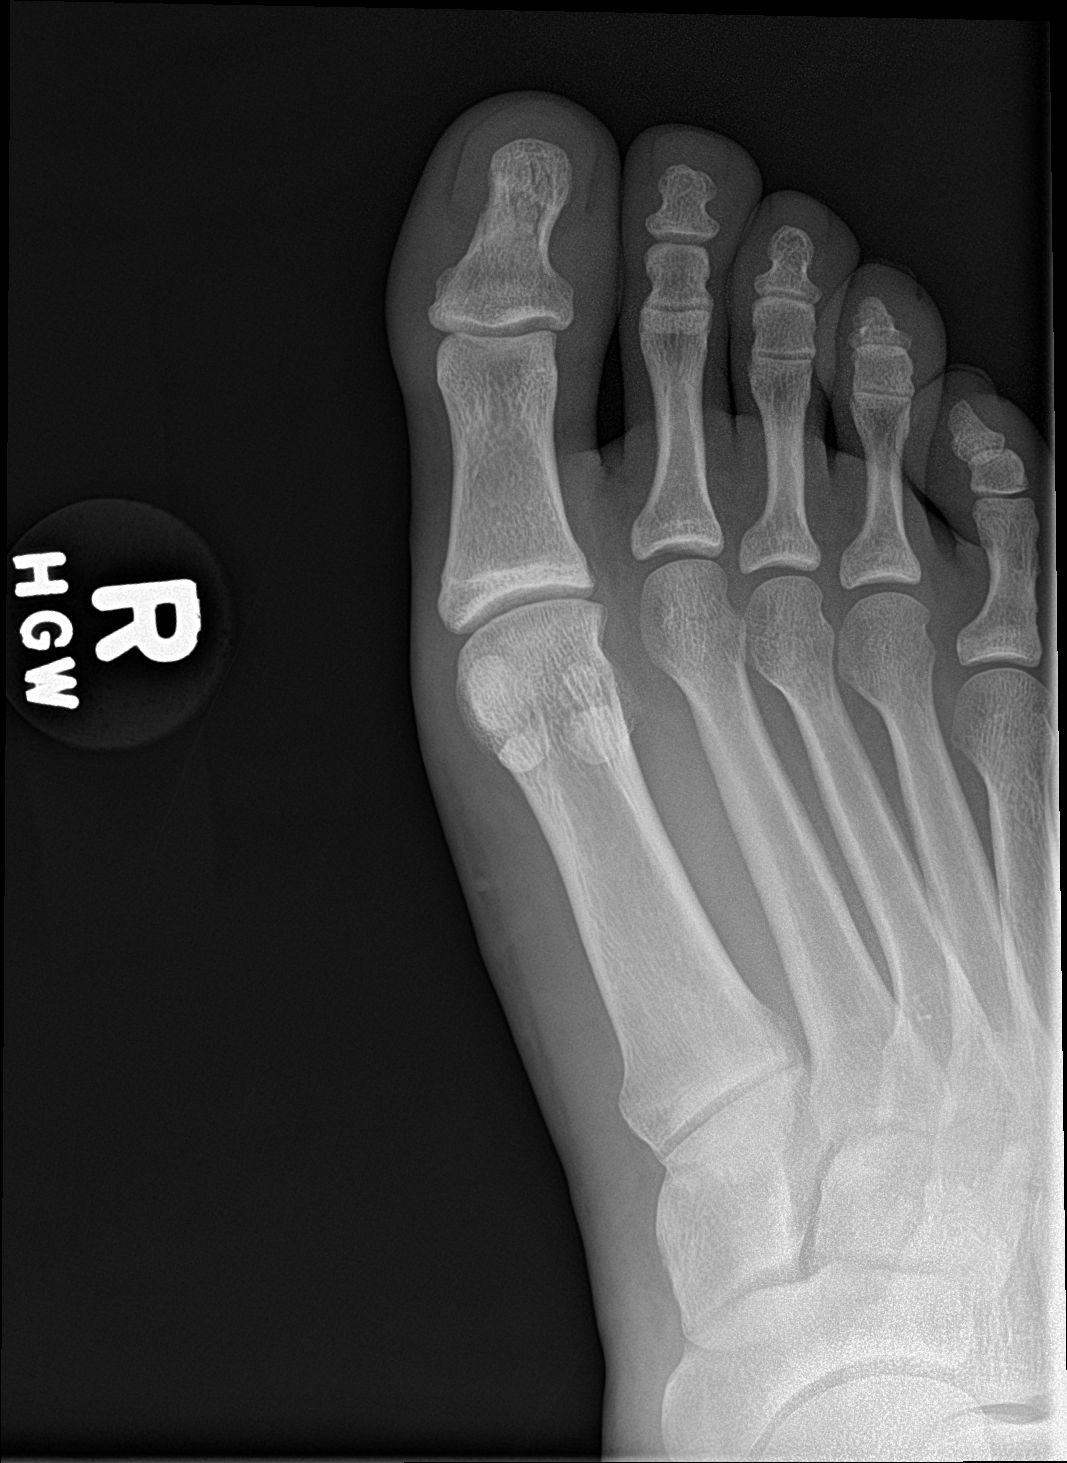

[toe obl]
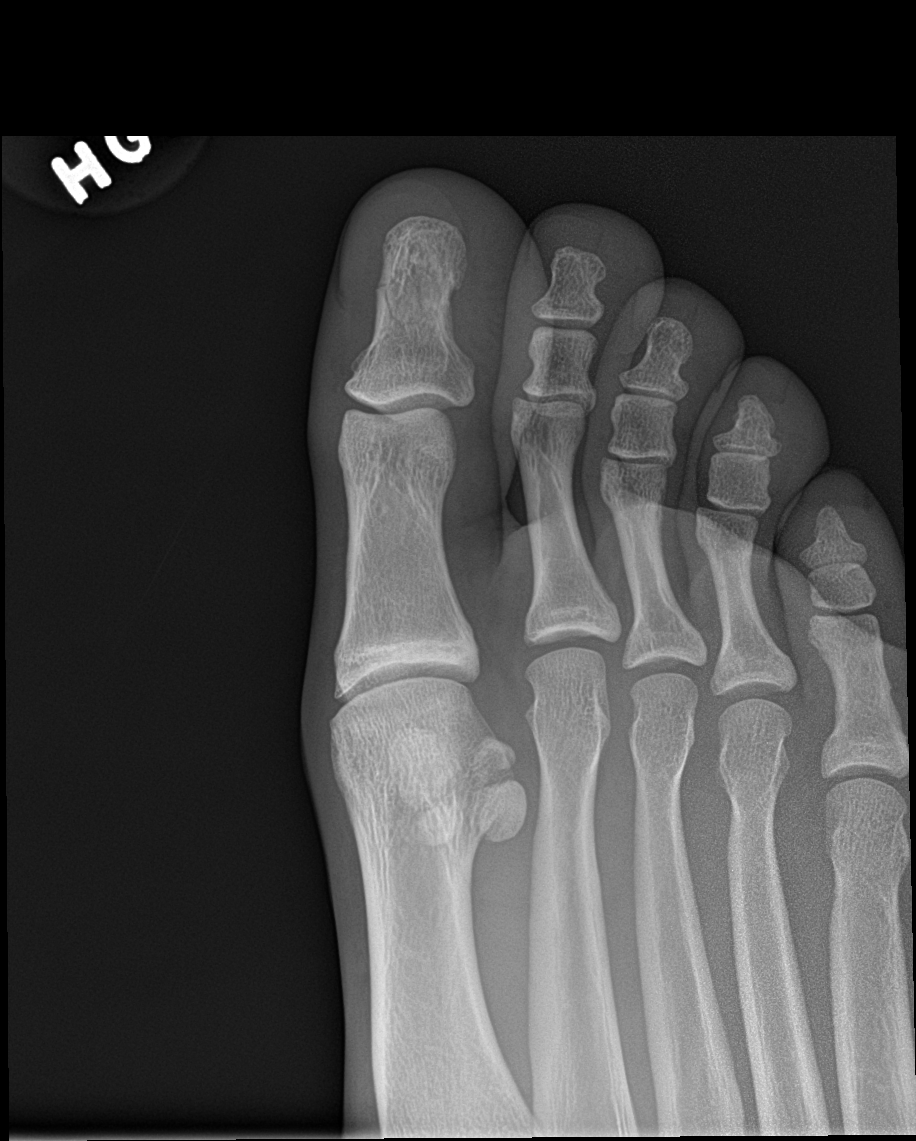

[toe lat]
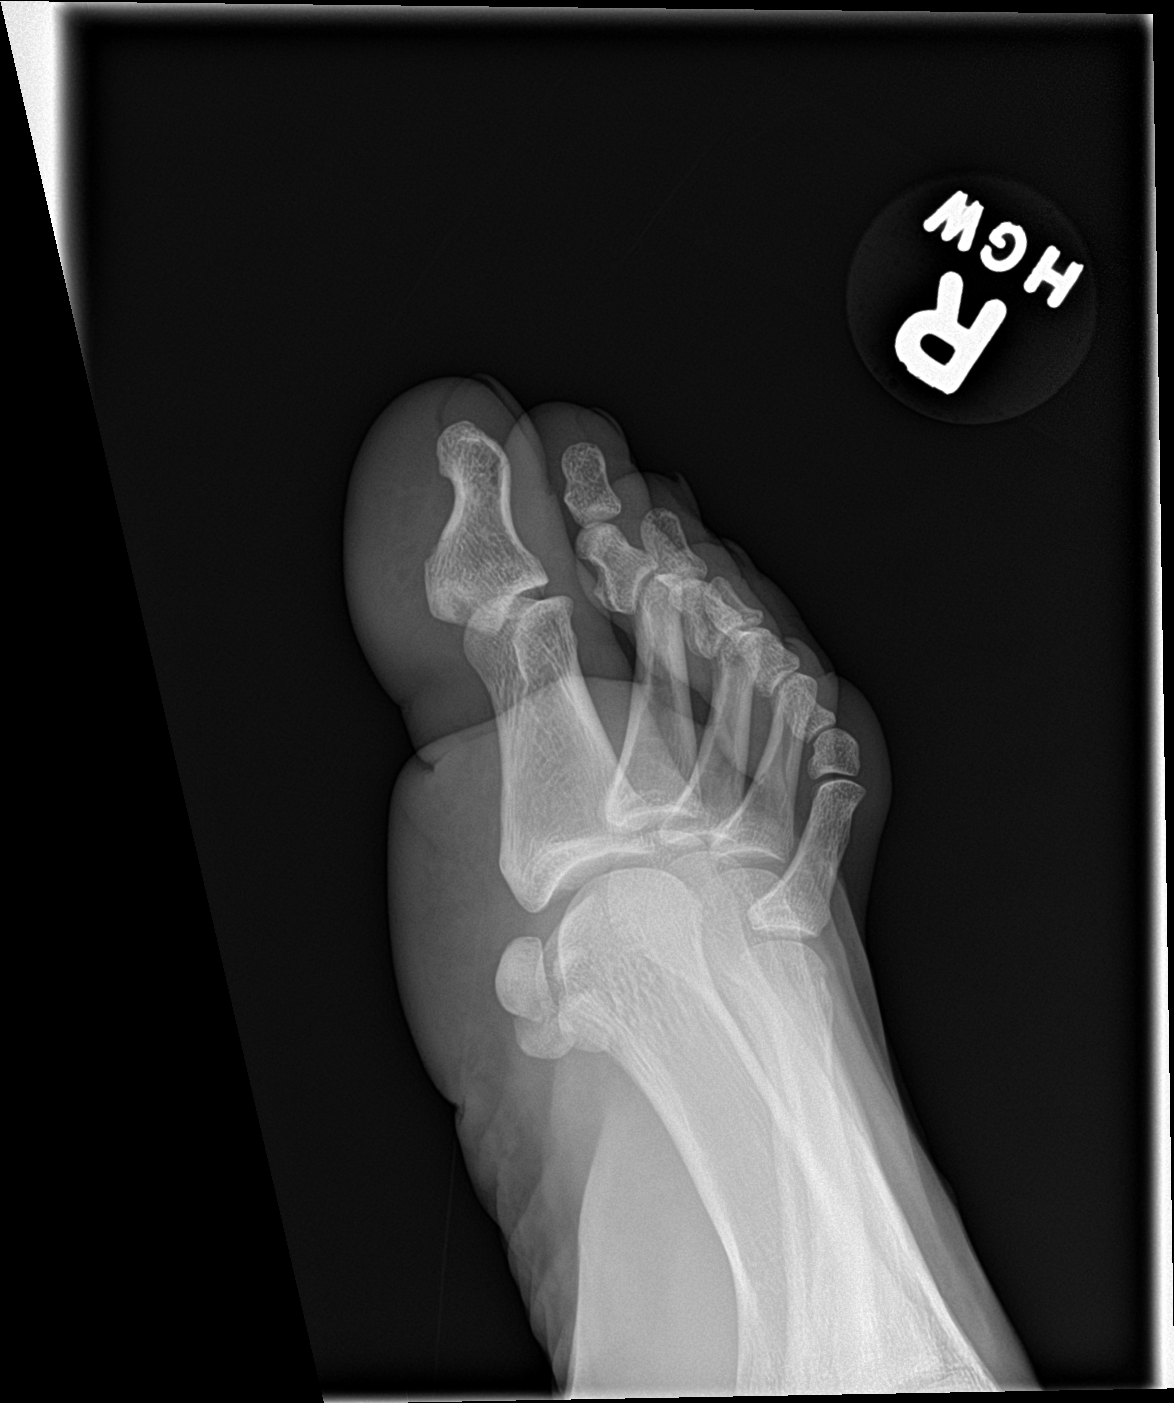

[3 of 3 positions shown; findings below may reference images not displayed]

FINDINGS: Linear lucencies along the midshaft of the distal phalanx of the
right first toe suggesting nondisplaced fractures. No articular
involvement. The tuft itself appears to be spared. No soft tissue
gas or foreign bodies.
IMPRESSION: Linear lucencies in the distal phalanx of the right first toe
suggesting nondisplaced fractures.

## 2017-10-04 ENCOUNTER — Ambulatory Visit (HOSPITAL_COMMUNITY)
Admission: EM | Admit: 2017-10-04 | Discharge: 2017-10-04 | Disposition: A | Discharge status: Home / Self Care | Payer: 59 | Attending: Emergency Medicine | Admitting: Emergency Medicine

## 2017-10-04 ENCOUNTER — Encounter (HOSPITAL_COMMUNITY): Payer: Self-pay | Admitting: Family Medicine

## 2017-10-04 DIAGNOSIS — K0889 Other specified disorders of teeth and supporting structures: Secondary | ICD-10-CM

## 2017-10-04 MED ORDER — AMOXICILLIN-POT CLAVULANATE 875-125 MG PO TABS: 1.0000 | ORAL_TABLET | Freq: Two times a day (BID) | ORAL | 0 refills | Status: AC

## 2017-10-04 NOTE — ED Triage Notes (Addendum)
Pt here for left jaw pain. Reports that he had a tooth extracted  last week. He hasnt been able to take the pain meds because his work will not allow.

## 2017-10-04 NOTE — ED Provider Notes (Signed)
[facility]    CSN: [id number] Arrival date & time: 10/04/17  1712     History   Chief Complaint Chief Complaint  Patient presents with  . Facial Pain    HPI Justin Haas is a 32 y.o. male presenting with jaw pain. He had a tooth removed last Thursday by [employer]. He has since developed increased pain over the last day in his lower jaw. He has ibuprofen and norco provided by dentist. He has been nervous about taking Norco because of sedation, but has also not been taking ibuprofen and antibiotic because his boss will not allow him to take medicine at work without a note. He is also concerned if he has a dry socket.   HPI  History reviewed. No pertinent past medical history.  There are no active problems to display for this patient.   History reviewed. No pertinent surgical history.     Home Medications    Prior to Admission medications   Medication Sig Start Date End Date Taking? Authorizing Provider  amoxicillin-clavulanate (AUGMENTIN) 875-125 MG tablet Take 1 tablet by mouth every 12 (twelve) hours for 7 days. 10/04/17 10/11/17  Wieters, Hallie C, PA-C  diphenhydrAMINE (BENADRYL) 25 mg capsule Take 25 mg by mouth every 6 (six) hours as needed for allergies.    [provider]  HYDROcodone-acetaminophen (NORCO/VICODIN) 5-325 MG tablet Take 1-2 tablets every 6 hours as needed for severe pain 09/03/15   Renne Crigler, PA-C  ibuprofen (ADVIL,MOTRIN) 200 MG tablet Take 600 mg by mouth every 6 (six) hours as needed for mild pain.    [provider]  lidocaine (XYLOCAINE) 2 % solution Use as directed 15 mLs in the mouth or throat as needed for mouth pain. 08/26/17   Maczis, Elmer Sow, PA-C  naproxen (NAPROSYN) 500 MG tablet Take 1 tablet (500 mg total) by mouth 2 (two) times daily. 09/03/15   Renne Crigler, PA-C    Family History History reviewed. No pertinent family history.  Social History Social History   Tobacco Use  . Smoking  status: Current Every Day Smoker    Packs/day: 0.50    Types: Cigarettes  . Smokeless tobacco: Never Used  Substance Use Topics  . Alcohol use: Yes  . Drug use: Yes    Types: IV, Marijuana     Allergies   Patient has no known allergies.   Review of Systems Review of Systems  Constitutional: Negative for fatigue and fever.  HENT: Positive for dental problem. Negative for sore throat.   Eyes: Negative for visual disturbance.  Respiratory: Negative for shortness of breath.   Cardiovascular: Negative for chest pain.  Gastrointestinal: Negative for nausea and vomiting.  Neurological: Positive for headaches. Negative for dizziness and light-headedness.     Physical Exam Triage Vital Signs ED Triage Vitals  Enc Vitals Group     BP 10/04/17 1728 (!) 143/82     Pulse Rate 10/04/17 1728 65     Resp 10/04/17 1728 18     Temp 10/04/17 1728 98.7 F (37.1 C)     Temp src --      SpO2 10/04/17 1728 100 %     Weight --      Height --      Head Circumference --      Peak Flow --      Pain Score 10/04/17 1726 7     Pain Loc --      Pain Edu? --      Excl.  in GC? --    No data found.  Updated Vital Signs BP (!) 143/82   Pulse 65   Temp 98.7 F (37.1 C)   Resp 18   SpO2 100%   Visual Acuity Right Eye Distance:   Left Eye Distance:   Bilateral Distance:    Right Eye Near:   Left Eye Near:    Bilateral Near:     Physical Exam  Constitutional: He appears well-developed and well-nourished.  HENT:  Head: Normocephalic and atraumatic.  Mouth/Throat: Uvula is midline, oropharynx is clear and moist and mucous membranes are normal. No oral lesions. No trismus in the jaw. No uvula swelling.    No increased facial swelling, gingival swelling. Clot appears to be present in socket.   Eyes: Conjunctivae are normal. Pupils are equal, round, and reactive to light.  Neck: Neck supple.  Cardiovascular: Normal rate and regular rhythm.  No murmur heard. Pulmonary/Chest: Effort  normal and breath sounds normal. No respiratory distress.  Abdominal: Soft. There is no tenderness.  Musculoskeletal: He exhibits no edema.  Neurological: He is alert.  Skin: Skin is warm and dry.  Psychiatric: He has a normal mood and affect.  Nursing note and vitals reviewed.    UC Treatments / Results  Labs (all labs ordered are listed, but only abnormal results are displayed) Labs Reviewed - No data to display  EKG  EKG Interpretation None       Radiology No results found.  Procedures Procedures (including critical care time)  Medications Ordered in UC Medications - No data to display   Initial Impression / Assessment and Plan / UC Course  I have reviewed the triage vital signs and the nursing notes.  Pertinent labs & imaging results that were available during my care of the patient were reviewed by me and considered in my medical decision making (see chart for details).     Patient with increased pain secondary to tooth removal. Will provide augmentin. Advised he should be okay to take norco in evening and should not cause sleepiness the next day for work. Do not take while working. Ibuprofen/Tylenol while at work. Follow up with [facility] if pain persisting in a few days without relief from measures today. Note provided for work to allow medication, avoid heavy lifting to prevent clot rupture in mouth.   Final Clinical Impressions(s) / UC Diagnoses   Final diagnoses:  Pain, dental    ED Discharge Orders        Ordered    amoxicillin-clavulanate (AUGMENTIN) 875-125 MG tablet  Every 12 hours     10/04/17 1853       Controlled Substance Prescriptions [place] Controlled Substance Registry consulted? Not Applicable   Lew DawesWieters, Hallie C, [place]PA-C 10/04/17 2242

## 2017-10-04 NOTE — Discharge Instructions (Signed)
Please take Augmentin twice daily for 7 days for infection.   Use anti-inflammatories for pain/swelling. You may take up to 800 mg Ibuprofen every 8 hours with food. You may supplement Ibuprofen with Tylenol [phone] mg every 8 hours.   It is safe for you to take hydrocodone in the evening for pain, as this should wear off by work the next morning.   Follow up with [facility] is pain persisting.

## 2018-02-05 ENCOUNTER — Other Ambulatory Visit: Payer: Self-pay

## 2018-02-05 ENCOUNTER — Encounter (HOSPITAL_COMMUNITY): Payer: Self-pay

## 2018-02-05 ENCOUNTER — Emergency Department (HOSPITAL_COMMUNITY)
Admission: EM | Admit: 2018-02-05 | Discharge: 2018-02-05 | Disposition: A | Discharge status: Home / Self Care | Payer: 59 | Attending: Emergency Medicine | Admitting: Emergency Medicine

## 2018-02-05 DIAGNOSIS — R51 Headache: Secondary | ICD-10-CM | POA: Insufficient documentation

## 2018-02-05 DIAGNOSIS — F17210 Nicotine dependence, cigarettes, uncomplicated: Secondary | ICD-10-CM | POA: Diagnosis not present

## 2018-02-05 DIAGNOSIS — K0889 Other specified disorders of teeth and supporting structures: Secondary | ICD-10-CM | POA: Diagnosis not present

## 2018-02-05 DIAGNOSIS — R519 Headache, unspecified: Secondary | ICD-10-CM

## 2018-02-05 DIAGNOSIS — Z79899 Other long term (current) drug therapy: Secondary | ICD-10-CM | POA: Diagnosis not present

## 2018-02-05 MED ORDER — KETOROLAC TROMETHAMINE 30 MG/ML IJ SOLN
30.0000 mg | Freq: Once | INTRAMUSCULAR | Status: AC
  Administered 2018-02-05: 30 mg via INTRAMUSCULAR
  Filled 2018-02-05: qty 1

## 2018-02-05 MED ORDER — NAPROXEN 500 MG PO TABS: 500.0000 mg | ORAL_TABLET | Freq: Two times a day (BID) | ORAL | 0 refills | Status: DC

## 2018-02-05 MED ORDER — PENICILLIN V POTASSIUM 500 MG PO TABS: 500.0000 mg | ORAL_TABLET | Freq: Four times a day (QID) | ORAL | 0 refills | Status: AC

## 2018-02-05 MED ORDER — LIDOCAINE VISCOUS HCL 2 % MT SOLN: 15.0000 mL | OROMUCOSAL | 0 refills | Status: DC | PRN

## 2018-02-05 NOTE — ED Notes (Signed)
Patient verbalizes understanding of discharge instructions. Opportunity for questioning and answers were provided. Armband removed by staff, pt discharged from ED ambulatory.   

## 2018-02-05 NOTE — ED Triage Notes (Signed)
Pt states that he woke up with a migraine today due to a tooth on the R upper side

## 2018-02-05 NOTE — Discharge Instructions (Addendum)
Please read and follow all provided instructions.  Your diagnoses today include:  1. Pain, dental   2. Bad headache     Tests performed today include: 1. Vital signs. See below for your results today.    Additional information:  Follow any educational materials contained in this packet.  You are having a headache. No specific cause was found today for your headache. It may have been a migraine or other cause of headache. Stress, anxiety, fatigue, and depression are common triggers for headaches.   Your headache today does not appear to be life-threatening or require hospitalization, but often the exact cause of headaches is not determined in the emergency department. Therefore, follow-up with your doctor is very important to find out what may have caused your headache and whether or not you need any further diagnostic testing or treatment.   Sometimes headaches can appear benign (not harmful), but then more serious symptoms can develop which should prompt an immediate re-evaluation by your doctor or the emergency department.  BE VERY CAREFUL not to take multiple medicines containing Tylenol (also called acetaminophen). Doing so can lead to an overdose which can damage your liver and cause liver failure and possibly death.   Note: The exam and treatment you received today has been provided on an emergency basis only. This is not a substitute for complete medical or dental care. This problem will not resolve on its own without the care of a dentist.   Take any prescribed medications only as directed. Use ibuprofen or naproxen for pain. Use the viscous lidocaine for mouth pain. Swish with the lidocaine and spit it out. Do not swallow it.  Please take all of your antibiotics until finished!   You may develop abdominal discomfort or diarrhea from the antibiotic.  You may help offset this with probiotics which you can buy or get in yogurt. Do not eat or take the probiotics until 2 hours after  your antibiotic. Do not take your medicine if develop an itchy rash, swelling in your mouth or lips, or difficulty breathing.   Home care instructions:  Follow any educational materials contained in this packet.  Follow-up instructions: Please follow-up with your dentist for further evaluation of your symptoms. Use resources if you do not have one. Call today to schedule follow up.   Dental Assistance: See handout for dental referrals  Return instructions:  1. Please return to the Emergency Department if you experience worsening symptoms. 2. Please return if you develop a fever, you develop more swelling in your face or neck, you have trouble breathing or swallowing food. 1. Return if the medications do not resolve your headache, if it recurs, or if you have multiple episodes of vomiting or cannot keep down fluids. 2. Return if you have a change from the usual headache. 3. RETURN IMMEDIATELY IF you: 1. Develop a sudden, severe headache 2. Develop confusion or become poorly responsive or faint 3. Develop a fever above 100.13F or problem breathing 4. Have a change in speech, vision, swallowing, or understanding 5. Develop new weakness, numbness, tingling, incoordination in your arms or legs 6. Have a seizure 3. Please return if you have any other emergent concerns.  Additional Information:  Your vital signs today were: BP 130/85 (BP Location: Right Arm)    Pulse 62    Temp 97.9 F (36.6 C) (Oral)    Resp 14    Ht  (1.905 m)    Wt 83.9 kg (185 lb)  SpO2 100%    BMI 23.12 kg/m  If your blood pressure (BP) was elevated above 135/85 this visit, please have this repeated by your doctor within one month. --------------

## 2018-02-05 NOTE — ED Provider Notes (Signed)
[facility] EMERGENCY DEPARTMENT Provider Note   CSN: [id number] Arrival date & time: 02/05/18  4098     History   Chief Complaint Chief Complaint  Patient presents with  . Headache  . Dental Pain    HPI Justin Haas is a 32 y.o. male with no significant past medical history presents emergency department today for dental pain.  Patient states for the last 1 week he has been having right upper dental pain after he was at a party and bit down on a chip and felt a crunch in his right upper tooth. Since that time he has been having pain when eating as well as with cold air. He reports he has been taking over the counter medication with mild to moderate relief. He has scheduled an appointment for this Friday with Dr. Jonny Ruiz at [facility] dentist office. He reports he is presenting today because when he awoke this morning the pain traveled into his right temporal area. He describes the pain as gradual onset and throbbing in nature. He has not taken anything for his symptoms.  He denies any fever, chills, voice change, inability to control secretions, head trauma, photophobia, phonophobia, nausea/vomiting, visual changes, facial drooping, neck pain/stiffness, hearing changes, tinnitus, focal weakness, nasal congestion, sinus pressure, sore throat, ear fullness.  He denies thunderclap onset.  Not worst headache of life.   HPI  History reviewed. No pertinent past medical history.  There are no active problems to display for this patient.   History reviewed. No pertinent surgical history.      Home Medications    Prior to Admission medications   Medication Sig Start Date End Date Taking? Authorizing Provider  diphenhydrAMINE (BENADRYL) 25 mg capsule Take 25 mg by mouth every 6 (six) hours as needed for allergies.    [provider]  HYDROcodone-acetaminophen (NORCO/VICODIN) 5-325 MG tablet Take 1-2 tablets every 6 hours as needed for severe pain 09/03/15   Renne Crigler, PA-C  ibuprofen (ADVIL,MOTRIN) 200 MG tablet Take 600 mg by mouth every 6 (six) hours as needed for mild pain.    [provider]  lidocaine (XYLOCAINE) 2 % solution Use as directed 15 mLs in the mouth or throat as needed for mouth pain. 08/26/17   Jakeb Lamping, Elmer Sow, PA-C  naproxen (NAPROSYN) 500 MG tablet Take 1 tablet (500 mg total) by mouth 2 (two) times daily. 09/03/15   Renne Crigler, PA-C    Family History No family history on file.  Social History Social History   Tobacco Use  . Smoking status: Current Every Day Smoker    Packs/day: 0.50    Types: Cigarettes  . Smokeless tobacco: Never Used  Substance Use Topics  . Alcohol use: Yes  . Drug use: Yes    Types: IV, Marijuana     Allergies   Patient has no known allergies.   Review of Systems Review of Systems  All other systems reviewed and are negative.    Physical Exam Updated Vital Signs BP 130/85 (BP Location: Right Arm)   Pulse 62   Temp 97.9 F (36.6 C) (Oral)   Resp 14   Ht  (1.905 m)   Wt 83.9 kg (185 lb)   SpO2 100%   BMI 23.12 kg/m   Physical Exam  Constitutional: He appears well-developed and well-nourished.  Non-toxic appearing  HENT:  Head: Normocephalic and atraumatic.  Right Ear: External ear normal.  Left Ear: External ear normal.  Nose: Nose normal.  Mouth/Throat: Uvula  is midline, oropharynx is clear and moist and mucous membranes are normal. No tonsillar exudate.  The patient has normal phonation and is in control of secretions. No stridor.  Midline uvula without edema. Soft palate rises symmetrically. No tonsillar erythema or exudates. No PTA. No edema posterior pharynx. Tongue protrusion is normal. No trismus. No creptius on neck palpation. Patient with pain on percussion of tooth number 3. No gingival erythema or fluctuance noted. No floor edema. No facial or neck swelling.  Mucus membranes moist.  No temporal tenderness to palpation.  Eyes: Pupils are equal,  round, and reactive to light. Right eye exhibits no discharge. Left eye exhibits no discharge. No scleral icterus.  Neck: Trachea normal. Neck supple. No spinous process tenderness present. No neck rigidity. Normal range of motion present.  No nuchal rigidity or meningismus. No swelling. No crepitus on neck palpation.   Cardiovascular: Normal rate, regular rhythm and intact distal pulses.  No murmur heard. Pulses:      Radial pulses are 2+ on the right side, and 2+ on the left side.       Dorsalis pedis pulses are 2+ on the right side, and 2+ on the left side.       Posterior tibial pulses are 2+ on the right side, and 2+ on the left side.  No lower extremity swelling or edema. Calves symmetric in size bilaterally.  Pulmonary/Chest: Effort normal and breath sounds normal. He exhibits no tenderness.  Abdominal: Soft. Bowel sounds are normal. There is no tenderness. There is no rebound and no guarding.  Musculoskeletal: He exhibits no edema.  Lymphadenopathy:    He has no cervical adenopathy.  Neurological: He is alert.  Mental Status:  Alert, oriented, thought content appropriate, able to give a coherent history. Speech fluent without evidence of aphasia. Able to follow 2 step commands without difficulty.  Cranial Nerves:  II:  Peripheral visual fields grossly normal, pupils equal, round, reactive to light III,IV, VI: ptosis not present, extra-ocular motions intact bilaterally  V,VII: smile symmetric, eyebrows raise symmetric, facial light touch sensation equal VIII: hearing grossly normal to voice  X: uvula elevates symmetrically  XI: bilateral shoulder shrug symmetric and strong XII: midline tongue extension without fassiculations Motor:  Normal tone. 5/5 in upper and lower extremities bilaterally including strong and equal grip strength and dorsiflexion/plantar flexion Sensory: Sensation intact to light touch in all extremities. Negative Romberg.  Deep Tendon Reflexes: 2+ and symmetric  in the biceps and patella Cerebellar: normal finger-to-nose with bilateral upper extremities. Normal heel-to -shin balance bilaterally of the lower extremity. No pronator drift.  Gait: normal gait and balance CV: distal pulses palpable throughout   Skin: Skin is warm and dry. No rash noted. He is not diaphoretic.  No vesicular-like rash on the scalp  Psychiatric: He has a normal mood and affect.  Nursing note and vitals reviewed.    ED Treatments / Results  Labs (all labs ordered are listed, but only abnormal results are displayed) Labs Reviewed - No data to display  EKG None  Radiology No results found.  Procedures Procedures (including critical care time)  Medications Ordered in ED Medications  ketorolac (TORADOL) 30 MG/ML injection 30 mg (has no administration in time range)     Initial Impression / Assessment and Plan / ED Course  I have reviewed the triage vital signs and the nursing notes.  Pertinent labs & imaging results that were available during my care of the patient were reviewed by me and considered  in my medical decision making (see chart for details).     32 y.o. male with right upper dental pain over the last 1 week. He has follow up this Friday, 02/07/18. Patient also had gradual onset of pain in right temporal area that is radiating from tooth. Patient pain improved in the department.  Patient exam is not concerning for [facility], meningitis or other emergent causes.  He is afebrile with no focal neurologic deficits, nuchal rigidity or change in vision.  Patient with pain to palpation of tooth #3.  No gross abscess.  Exam is not concerning for Ludwig's or spread of infection.  Will treat with penicillin and anti-inflammatory medicines.  Patient offered dental block in the emergency department but states pain is not severe enough to require one at this time. I advised the patient to follow-up with dentist this Friday during scheduled appointment. Specific return  precautions discussed. Time was given for all questions to be answered. The patient verbalized understanding and agreement with plan. The patient appears safe for discharge home.  Final Clinical Impressions(s) / ED Diagnoses   Final diagnoses:  Pain, dental  Bad headache    ED Discharge Orders        Ordered    lidocaine (XYLOCAINE) 2 % solution  As needed     02/05/18 0810    penicillin v potassium (VEETID) 500 MG tablet  4 times daily     02/05/18 0810    naproxen (NAPROSYN) 500 MG tablet  2 times daily     02/05/18 0810       Jacinto Halim, PA-C 02/05/18 1610    Arby Barrette, MD 02/09/18 [phone]

## 2018-03-08 ENCOUNTER — Emergency Department (HOSPITAL_COMMUNITY)
Admission: EM | Admit: 2018-03-08 | Discharge: 2018-03-08 | Disposition: A | Discharge status: Home / Self Care | Payer: 59 | Attending: Emergency Medicine | Admitting: Emergency Medicine

## 2018-03-08 ENCOUNTER — Other Ambulatory Visit: Payer: Self-pay

## 2018-03-08 ENCOUNTER — Encounter (HOSPITAL_COMMUNITY): Payer: Self-pay | Admitting: Emergency Medicine

## 2018-03-08 DIAGNOSIS — R809 Proteinuria, unspecified: Secondary | ICD-10-CM | POA: Diagnosis not present

## 2018-03-08 DIAGNOSIS — F17210 Nicotine dependence, cigarettes, uncomplicated: Secondary | ICD-10-CM | POA: Insufficient documentation

## 2018-03-08 DIAGNOSIS — K920 Hematemesis: Secondary | ICD-10-CM | POA: Diagnosis not present

## 2018-03-08 DIAGNOSIS — D649 Anemia, unspecified: Secondary | ICD-10-CM

## 2018-03-08 DIAGNOSIS — Z79899 Other long term (current) drug therapy: Secondary | ICD-10-CM | POA: Diagnosis not present

## 2018-03-08 LAB — URINALYSIS, ROUTINE W REFLEX MICROSCOPIC
Bacteria, UA: NONE SEEN
Glucose, UA: NEGATIVE mg/dL
Hgb urine dipstick: NEGATIVE
Ketones, ur: NEGATIVE mg/dL
Leukocytes, UA: NEGATIVE
NITRITE: NEGATIVE
PH: 8 (ref 5.0–8.0)
Protein, ur: 100 mg/dL — AB
SPECIFIC GRAVITY, URINE: 1.027 (ref 1.005–1.030)

## 2018-03-08 LAB — CBC
HCT: 40.1 % (ref 39.0–52.0)
HEMOGLOBIN: 12.5 g/dL — AB (ref 13.0–17.0)
MCH: 28.9 pg (ref 26.0–34.0)
MCHC: 31.2 g/dL (ref 30.0–36.0)
MCV: 92.6 fL (ref 78.0–100.0)
Platelets: 263 10*3/uL (ref 150–400)
RBC: 4.33 MIL/uL (ref 4.22–5.81)
RDW: 11.6 % (ref 11.5–15.5)
WBC: 7.1 10*3/uL (ref 4.0–10.5)

## 2018-03-08 LAB — COMPREHENSIVE METABOLIC PANEL
ALBUMIN: 4.2 g/dL (ref 3.5–5.0)
ALK PHOS: 35 U/L — AB (ref 38–126)
ALT: 15 U/L — ABNORMAL LOW (ref 17–63)
ANION GAP: 10 (ref 5–15)
AST: 15 U/L (ref 15–41)
BUN: 10 mg/dL (ref 6–20)
CO2: 30 mmol/L (ref 22–32)
Calcium: 9.2 mg/dL (ref 8.9–10.3)
Chloride: 101 mmol/L (ref 101–111)
Creatinine, Ser: 0.97 mg/dL (ref 0.61–1.24)
GFR calc non Af Amer: >60 mL/min (ref >60)
GLUCOSE: 106 mg/dL — AB (ref 65–99)
Potassium: 3.9 mmol/L (ref 3.5–5.1)
Sodium: 141 mmol/L (ref 135–145)
Total Bilirubin: 0.7 mg/dL (ref 0.3–1.2)
Total Protein: 6.8 g/dL (ref 6.5–8.1)

## 2018-03-08 LAB — LIPASE, BLOOD: Lipase: 68 U/L — ABNORMAL HIGH (ref 11–51)

## 2018-03-08 MED ORDER — FAMOTIDINE 20 MG PO TABS: 20.0000 mg | ORAL_TABLET | Freq: Two times a day (BID) | ORAL | 0 refills | Status: DC

## 2018-03-08 MED ORDER — ONDANSETRON HCL 4 MG PO TABS: 4.0000 mg | ORAL_TABLET | Freq: Three times a day (TID) | ORAL | 0 refills | Status: DC | PRN

## 2018-03-08 NOTE — ED Provider Notes (Signed)
[facility] Provider Note   CSN: [id number] Arrival date & time: 03/08/18  [id number]     History   Chief Complaint Chief Complaint  Patient presents with  . Hematemesis    HPI Alphonzo CruiseJonathan Akram is a 32 y.o. male who presents the emergency department chief complaint of hematemesis.  Patient works in a [employer] at night.  He states that he was sweaty and working very hard in the heat and feels like he got overheated.  He became a little bit nauseous and lightheaded and then had one episode of vomiting.  He states that he noticed about a dime size amount of streaky blood in his vomit which concerned him and brought him to the ER.  He denies alcohol abuse or history of previous episodes of hematemesis.  He has had no other episodes of vomiting and denies nausea, abdominal pain, diarrhea or constipation.  HPI  History reviewed. No pertinent past medical history.  There are no active problems to display for this patient.   History reviewed. No pertinent surgical history.      Home Medications    Prior to Admission medications   Medication Sig Start Date End Date Taking? Authorizing Provider  diphenhydrAMINE (BENADRYL) 25 mg capsule Take 25 mg by mouth every 6 (six) hours as needed for allergies.    [provider]  famotidine (PEPCID) 20 MG tablet Take 1 tablet (20 mg total) by mouth 2 (two) times daily. 03/08/18   Arthor CaptainHarris, Krystan Northrop, PA-C  HYDROcodone-acetaminophen (NORCO/VICODIN) 5-325 MG tablet Take 1-2 tablets every 6 hours as needed for severe pain Patient not taking: Reported on 02/05/2018 09/03/15   Renne CriglerGeiple, Joshua, PA-C  ibuprofen (ADVIL,MOTRIN) 200 MG tablet Take 600 mg by mouth every 6 (six) hours as needed for mild pain.    [provider]  lidocaine (XYLOCAINE) 2 % solution Use as directed 15 mLs in the mouth or throat as needed for mouth pain. 02/05/18   Maczis, Elmer SowMichael M, PA-C  naproxen (NAPROSYN)  500 MG tablet Take 1 tablet (500 mg total) by mouth 2 (two) times daily. 02/05/18   Maczis, Elmer SowMichael M, PA-C  ondansetron (ZOFRAN) 4 MG tablet Take 1 tablet (4 mg total) by mouth every 8 (eight) hours as needed for nausea or vomiting. 03/08/18   Arthor CaptainHarris, Kannon Baum, PA-C    Family History No family history on file.  Social History Social History   Tobacco Use  . Smoking status: Current Every Day Smoker    Packs/day: 0.50    Types: Cigarettes  . Smokeless tobacco: Never Used  Substance Use Topics  . Alcohol use: Yes  . Drug use: Yes    Types: IV, Marijuana     Allergies   Patient has no known allergies.   Review of Systems Review of Systems Ten systems reviewed and are negative for acute change, except as noted in the HPI.    Physical Exam Updated Vital Signs BP 125/70   Pulse (!) 55   Temp 98 F (36.7 C)   Resp 12   SpO2 100%   Physical Exam  Constitutional: He is oriented to person, place, and time. He appears well-developed and well-nourished. No distress.  HENT:  Head: Normocephalic and atraumatic.  Eyes: Conjunctivae are normal. No scleral icterus.  Neck: Normal range of motion. Neck supple.  Cardiovascular: Normal rate, regular rhythm and normal heart sounds.  Pulmonary/Chest: Effort normal and breath sounds normal. No respiratory distress.  Abdominal: Soft. Bowel sounds  are normal. He exhibits no distension and no mass. There is no tenderness. There is no guarding.  Musculoskeletal: Normal range of motion. He exhibits no edema.  Neurological: He is alert and oriented to person, place, and time.  Skin: Skin is warm and dry. Capillary refill takes less than 2 seconds. He is not diaphoretic.  Psychiatric: His behavior is normal.  Nursing note and vitals reviewed.    ED Treatments / Results  Labs (all labs ordered are listed, but only abnormal results are displayed) Labs Reviewed  LIPASE, BLOOD - Abnormal; Notable for the following components:      Result  Value   Lipase 68 (*)    All other components within normal limits  COMPREHENSIVE METABOLIC PANEL - Abnormal; Notable for the following components:   Glucose, Bld 106 (*)    ALT 15 (*)    Alkaline Phosphatase 35 (*)    All other components within normal limits  CBC - Abnormal; Notable for the following components:   Hemoglobin 12.5 (*)    All other components within normal limits  URINALYSIS, ROUTINE W REFLEX MICROSCOPIC - Abnormal; Notable for the following components:   APPearance CLOUDY (*)    Bilirubin Urine SMALL (*)    Protein, ur 100 (*)    All other components within normal limits    EKG None  Radiology No results found.  Procedures Procedures (including critical care time)  Medications Ordered in ED Medications - No data to display   Initial Impression / Assessment and Plan / ED Course  I have reviewed the triage vital signs and the nursing notes.  Pertinent labs & imaging results that were available during my care of the patient were reviewed by me and considered in my medical decision making (see chart for details).     Patient with a small amount of bilirubin in his urine, proteinuria, hemoglobin is 12.8 and slightly anemic but this appears to be his baseline.  Lipase slightly elevated without elevated transaminases.  The patient denies alcohol abuse although his labs are somewhat suspicious for this.  He denies chronic abdominal pain so I doubt bleeding ulcer.  I suspect likely the patient had a small Mallory-Weiss tear with forceful vomiting one time.  Patient has normal vital signs and appears appropriate for discharge at this time.  Will discharge with Zofran and PPI.  Final Clinical Impressions(s) / ED Diagnoses   Final diagnoses:  Hematemesis, presence of nausea not specified  Proteinuria, unspecified type  Anemia, unspecified type    ED Discharge Orders        Ordered    ondansetron (ZOFRAN) 4 MG tablet  Every 8 hours PRN,   Status:  Discontinued      03/08/18 0812    famotidine (PEPCID) 20 MG tablet  2 times daily     03/08/18 0813    ondansetron (ZOFRAN) 4 MG tablet  Every 8 hours PRN     03/08/18 0813       Arthor Captain, PA-C 03/08/18 1610    Benjiman Core, MD 03/08/18 1640

## 2018-03-08 NOTE — ED Triage Notes (Signed)
Pt reports one episode of emesis tonight, states he saw a little blood in it, denies any diarrhea or pain at this time. resp e/u, nad.

## 2018-03-08 NOTE — Discharge Instructions (Signed)
Get help right away if: You faint or feel extremely weak. You have a rapid heartbeat. You are urinating less than normal or not at all. You have persistent vomiting. You vomit large amounts of bloody or dark material. You vomit bright red blood. You pass large, dark, or bloody stools. You have chest pain or trouble breathin

## 2018-10-14 ENCOUNTER — Other Ambulatory Visit: Payer: Self-pay

## 2018-10-14 ENCOUNTER — Encounter (HOSPITAL_COMMUNITY): Payer: Self-pay | Admitting: Emergency Medicine

## 2018-10-14 ENCOUNTER — Ambulatory Visit (HOSPITAL_COMMUNITY)
Admission: EM | Admit: 2018-10-14 | Discharge: 2018-10-14 | Disposition: A | Discharge status: Home / Self Care | Payer: 59 | Attending: Family Medicine | Admitting: Family Medicine

## 2018-10-14 DIAGNOSIS — K0889 Other specified disorders of teeth and supporting structures: Secondary | ICD-10-CM | POA: Diagnosis not present

## 2018-10-14 MED ORDER — AMOXICILLIN-POT CLAVULANATE 875-125 MG PO TABS: 1.0000 | ORAL_TABLET | Freq: Two times a day (BID) | ORAL | 0 refills | Status: AC

## 2018-10-14 MED ORDER — HYDROCODONE-ACETAMINOPHEN 5-325 MG PO TABS: 1.0000 | ORAL_TABLET | Freq: Four times a day (QID) | ORAL | 0 refills | Status: AC | PRN

## 2018-10-14 NOTE — Discharge Instructions (Signed)

## 2018-10-14 NOTE — ED Provider Notes (Signed)
[facility]    CSN: [id number] Arrival date & time: 10/14/18  1756     History   Chief Complaint Chief Complaint  Patient presents with  . Dental Pain    HPI Justin Haas is a 33 y.o. male no significant past medical history presenting today for evaluation of dental pain.  Patient states that this morning after eating something he began to have pain in his right bottom tooth.  He denies any swelling.  Does state that he has had issues with previous teeth on his right upper jaw and left lower jaw, but not typically with this tooth.  Denies any fracture or issues with the tooth itself.  Denies difficulty swallowing.  Denies difficulty moving neck.  Denies fevers.  He has taken 400 mg ibuprofen twice today without relief.  HPI  History reviewed. No pertinent past medical history.  There are no active problems to display for this patient.   History reviewed. No pertinent surgical history.     Home Medications    Prior to Admission medications   Medication Sig Start Date End Date Taking? Authorizing Provider  amoxicillin-clavulanate (AUGMENTIN) 875-125 MG tablet Take 1 tablet by mouth every 12 (twelve) hours for 7 days. 10/14/18 10/21/18  Kendallyn Lippold C, PA-C  HYDROcodone-acetaminophen (NORCO/VICODIN) 5-325 MG tablet Take 1 tablet by mouth every 6 (six) hours as needed for severe pain. 10/14/18   Aleenah Homen, Junius CreamerHallie C, PA-C    Family History Family History  Problem Relation Age of Onset  . Hypertension Mother     Social History Social History   Tobacco Use  . Smoking status: Former Smoker    Packs/day: 0.50    Types: Cigarettes  . Smokeless tobacco: Never Used  Substance Use Topics  . Alcohol use: Yes  . Drug use: Yes    Types: IV, Marijuana     Allergies   Patient has no known allergies.   Review of Systems Review of Systems  Constitutional: Negative for activity change, appetite change, chills, fatigue and fever.  HENT: Positive for dental  problem. Negative for congestion, ear pain, rhinorrhea, sinus pressure, sore throat and trouble swallowing.   Eyes: Negative for discharge and redness.  Respiratory: Negative for cough, chest tightness and shortness of breath.   Cardiovascular: Negative for chest pain.  Gastrointestinal: Negative for abdominal pain, diarrhea, nausea and vomiting.  Musculoskeletal: Negative for myalgias.  Skin: Negative for rash.  Neurological: Positive for headaches. Negative for dizziness and light-headedness.     Physical Exam Triage Vital Signs ED Triage Vitals  Enc Vitals Group     BP 10/14/18 1900 (!) 144/73     Pulse Rate 10/14/18 1900 80     Resp 10/14/18 1900 18     Temp 10/14/18 1900 98.5 F (36.9 C)     Temp Source 10/14/18 1900 Oral     SpO2 10/14/18 1900 99 %     Weight --      Height --      Head Circumference --      Peak Flow --      Pain Score 10/14/18 1856 4     Pain Loc --      Pain Edu? --      Excl. in GC? --    No data found.  Updated Vital Signs BP (!) 144/73 (BP Location: Left Arm)   Pulse 80   Temp 98.5 F (36.9 C) (Oral)   Resp 18   SpO2 99%   Visual Acuity Right  Eye Distance:   Left Eye Distance:   Bilateral Distance:    Right Eye Near:   Left Eye Near:    Bilateral Near:     Physical Exam Vitals signs and nursing note reviewed.  Constitutional:      Appearance: He is well-developed.  HENT:     Head: Normocephalic and atraumatic.     Mouth/Throat:     Comments: Posterior molar with mild gingival tenderness surrounding, no obvious facial swelling, no soft palate swelling, posterior pharynx patent, nontender to palpation of soft palate below the tongue Eyes:     Conjunctiva/sclera: Conjunctivae normal.  Neck:     Musculoskeletal: Neck supple.     Comments: Full active range of motion of neck Cardiovascular:     Rate and Rhythm: Normal rate and regular rhythm.     Heart sounds: No murmur.  Pulmonary:     Effort: Pulmonary effort is normal. No  respiratory distress.     Breath sounds: Normal breath sounds.  Abdominal:     Palpations: Abdomen is soft.     Tenderness: There is no abdominal tenderness.  Skin:    General: Skin is warm and dry.  Neurological:     Mental Status: He is alert.      UC Treatments / Results  Labs (all labs ordered are listed, but only abnormal results are displayed) Labs Reviewed - No data to display  EKG None  Radiology No results found.  Procedures Procedures (including critical care time)  Medications Ordered in UC Medications - No data to display  Initial Impression / Assessment and Plan / UC Course  I have reviewed the triage vital signs and the nursing notes.  Pertinent labs & imaging results that were available during my care of the patient were reviewed by me and considered in my medical decision making (see chart for details).     Patient with dental pain, will initiate treatment with antibiotics to cover for underlying infection.  Will initiate on Augmentin.  Recommended Tylenol and ibuprofen for mild to moderate pain, discussed appropriate dosing of these medicines.  Also provided a couple days worth of hydrocodone for more severe pain or nighttime pain.  Advised to use sparingly.  Do not drive or work after taking.Discussed strict return precautions. Patient verbalized understanding and is agreeable with plan.  Final Clinical Impressions(s) / UC Diagnoses   Final diagnoses:  Pain, dental     Discharge Instructions     Please use dental resource to contact offices to seek permenant treatment/relief.   Today we have given you an antibiotic. This should help with pain as any infection is cleared.   For pain please take 600mg -800mg  of Ibuprofen every 8 hours, take with 1000 mg of Tylenol Extra strength every 8 hours. These are safe to take together. Please take with food.   I have also provided 2 days worth of stronger pain medication. This should only be used for severe  pain. Do not drive or operate machinery while taking this medication.   Please return if you start to experience significant swelling of your face, experiencing fever.   ED Prescriptions    Medication Sig Dispense Auth. Provider   amoxicillin-clavulanate (AUGMENTIN) 875-125 MG tablet Take 1 tablet by mouth every 12 (twelve) hours for 7 days. 14 tablet Meagan Spease C, PA-C   HYDROcodone-acetaminophen (NORCO/VICODIN) 5-325 MG tablet Take 1 tablet by mouth every 6 (six) hours as needed for severe pain. 6 tablet Denica Web, [place] C, PA-C  Controlled Substance Prescriptions [place] Controlled Substance Registry consulted? Yes, I have consulted the [place] Controlled Substances Registry for this patient, and feel the risk/benefit ratio today is favorable for proceeding with this prescription for a controlled substance.   Lew DawesWieters, Lynise Porr C, [place]PA-C 10/14/18 2056

## 2018-10-14 NOTE — ED Triage Notes (Signed)
Toothache pain started this morning.  Reports this is a bottom, right tooth that is hurting

## 2020-10-24 ENCOUNTER — Encounter (HOSPITAL_COMMUNITY): Payer: Self-pay

## 2020-10-24 ENCOUNTER — Ambulatory Visit (HOSPITAL_COMMUNITY)
Admission: EM | Admit: 2020-10-24 | Discharge: 2020-10-24 | Disposition: A | Discharge status: Home / Self Care | Payer: 59

## 2020-10-24 DIAGNOSIS — J069 Acute upper respiratory infection, unspecified: Secondary | ICD-10-CM

## 2020-10-24 NOTE — ED Provider Notes (Signed)
[facility]    CSN: [id number] Arrival date & time: 10/24/20  2947      History   Chief Complaint Chief Complaint  Patient presents with  . Cough  . Nasal Congestion    HPI Justin Haas is a 35 y.o. male.   Here today with 5-6 day history of runny nose, cough, sore throat that has now resolved. Denies fever, chills, CP, SOB, abdominal pain, body aches. Took theraflu as needed until sxs resolved. Hx of COVID infection 6 weeks ago. States work requires a note to return. No known chronic medical problems.      History reviewed. No pertinent past medical history.  There are no problems to display for this patient.   History reviewed. No pertinent surgical history.     Home Medications    Prior to Admission medications   Medication Sig Start Date End Date Taking? Authorizing Provider  HYDROcodone-acetaminophen (NORCO/VICODIN) 5-325 MG tablet Take 1 tablet by mouth every 6 (six) hours as needed for severe pain. 10/14/18   Wieters, Junius Creamer, PA-C    Family History Family History  Problem Relation Age of Onset  . Hypertension Mother     Social History Social History   Tobacco Use  . Smoking status: Former Smoker    Packs/day: 0.50    Types: Cigarettes  . Smokeless tobacco: Never Used  Substance Use Topics  . Alcohol use: Yes  . Drug use: Yes    Types: IV, Marijuana     Allergies   Patient has no known allergies.   Review of Systems Review of Systems PER HPI   Physical Exam Triage Vital Signs ED Triage Vitals  Enc Vitals Group     BP 10/24/20 1202 130/69     Pulse Rate 10/24/20 1202 72     Resp 10/24/20 1202 18     Temp 10/24/20 1202 98.8 F (37.1 C)     Temp Source 10/24/20 1202 Oral     SpO2 10/24/20 1202 98 %     Weight --      Height --      Head Circumference --      Peak Flow --      Pain Score 10/24/20 1159 0     Pain Loc --      Pain Edu? --      Excl. in GC? --    No data found.  Updated Vital Signs BP 130/69  (BP Location: Left Arm)   Pulse 72   Temp 98.8 F (37.1 C) (Oral)   Resp 18   SpO2 98%   Visual Acuity Right Eye Distance:   Left Eye Distance:   Bilateral Distance:    Right Eye Near:   Left Eye Near:    Bilateral Near:     Physical Exam Vitals and nursing note reviewed.  Constitutional:      Appearance: Normal appearance.  HENT:     Head: Atraumatic.     Right Ear: Tympanic membrane normal.     Left Ear: Tympanic membrane normal.     Nose: Nose normal.     Mouth/Throat:     Mouth: Mucous membranes are moist.     Pharynx: Oropharynx is clear.  Eyes:     Extraocular Movements: Extraocular movements intact.     Conjunctiva/sclera: Conjunctivae normal.  Cardiovascular:     Rate and Rhythm: Normal rate and regular rhythm.  Pulmonary:     Effort: Pulmonary effort is normal. No respiratory distress.  Breath sounds: Normal breath sounds. No wheezing or rales.  Abdominal:     General: Bowel sounds are normal. There is no distension.     Palpations: Abdomen is soft.     Tenderness: There is no abdominal tenderness. There is no guarding.  Musculoskeletal:        General: Normal range of motion.     Cervical back: Normal range of motion and neck supple.  Skin:    General: Skin is warm and dry.  Neurological:     General: No focal deficit present.     Mental Status: He is oriented to person, place, and time.  Psychiatric:        Mood and Affect: Mood normal.        Thought Content: Thought content normal.        Judgment: Judgment normal.     UC Treatments / Results  Labs (all labs ordered are listed, but only abnormal results are displayed) Labs Reviewed - No data to display  EKG   Radiology No results found.  Procedures Procedures (including critical care time)  Medications Ordered in UC Medications - No data to display  Initial Impression / Assessment and Plan / UC Course  I have reviewed the triage vital signs and the nursing notes.  Pertinent  labs & imaging results that were available during my care of the patient were reviewed by me and considered in my medical decision making (see chart for details).     Sxs resolved, past quarantine window even if COVID but very unlikely given recent infection 6 weeks ago. OK to return back to work. Vitals and exam benign today. Work note given.  Return if sxs worsen again.   Final Clinical Impressions(s) / UC Diagnoses   Final diagnoses:  Viral URI with cough   Discharge Instructions   None    ED Prescriptions    None     PDMP not reviewed this encounter.   Particia Nearing, [place] 10/24/20 1228

## 2020-10-24 NOTE — ED Triage Notes (Signed)
Pt here today for return to work note after having mild cough, runny nose, congestion last week that has since resolved.   Pt states he had COVID illness in December 2021.  Denies fever, n/v/d.  Took Theraflu-last taken Saturday.
